# Patient Record
Sex: Female | Born: 1955 | Race: White | Hispanic: No | Marital: Married | Smoking: Never smoker
Health system: Southern US, Community
[De-identification: ages and names within clinical notes are randomized; demographics above are authoritative.]

## PROBLEM LIST (undated history)

## (undated) DIAGNOSIS — I10 Essential (primary) hypertension: Secondary | ICD-10-CM

## (undated) DIAGNOSIS — I341 Nonrheumatic mitral (valve) prolapse: Secondary | ICD-10-CM

## (undated) DIAGNOSIS — G9332 Myalgic encephalomyelitis/chronic fatigue syndrome: Secondary | ICD-10-CM

## (undated) DIAGNOSIS — E039 Hypothyroidism, unspecified: Secondary | ICD-10-CM

## (undated) DIAGNOSIS — F329 Major depressive disorder, single episode, unspecified: Secondary | ICD-10-CM

## (undated) DIAGNOSIS — R351 Nocturia: Secondary | ICD-10-CM

## (undated) DIAGNOSIS — F419 Anxiety disorder, unspecified: Secondary | ICD-10-CM

## (undated) DIAGNOSIS — IMO0002 Reserved for concepts with insufficient information to code with codable children: Secondary | ICD-10-CM

## (undated) DIAGNOSIS — N6019 Diffuse cystic mastopathy of unspecified breast: Secondary | ICD-10-CM

## (undated) DIAGNOSIS — R0683 Snoring: Principal | ICD-10-CM

## (undated) DIAGNOSIS — Z8489 Family history of other specified conditions: Secondary | ICD-10-CM

## (undated) DIAGNOSIS — E041 Nontoxic single thyroid nodule: Secondary | ICD-10-CM

## (undated) DIAGNOSIS — L719 Rosacea, unspecified: Secondary | ICD-10-CM

## (undated) DIAGNOSIS — I7300 Raynaud's syndrome without gangrene: Secondary | ICD-10-CM

## (undated) DIAGNOSIS — G43909 Migraine, unspecified, not intractable, without status migrainosus: Secondary | ICD-10-CM

## (undated) DIAGNOSIS — S060XAA Concussion with loss of consciousness status unknown, initial encounter: Secondary | ICD-10-CM

## (undated) DIAGNOSIS — R011 Cardiac murmur, unspecified: Secondary | ICD-10-CM

## (undated) DIAGNOSIS — K219 Gastro-esophageal reflux disease without esophagitis: Secondary | ICD-10-CM

## (undated) DIAGNOSIS — I340 Nonrheumatic mitral (valve) insufficiency: Secondary | ICD-10-CM

## (undated) DIAGNOSIS — R5382 Chronic fatigue, unspecified: Secondary | ICD-10-CM

## (undated) DIAGNOSIS — B009 Herpesviral infection, unspecified: Secondary | ICD-10-CM

## (undated) DIAGNOSIS — F32A Depression, unspecified: Secondary | ICD-10-CM

## (undated) HISTORY — PX: SHOULDER SURGERY: SHX246

## (undated) HISTORY — PX: BREAST EXCISIONAL BIOPSY: SUR124

## (undated) HISTORY — DX: Nonrheumatic mitral (valve) insufficiency: I34.0

## (undated) HISTORY — DX: Nontoxic single thyroid nodule: E04.1

## (undated) HISTORY — DX: Nonrheumatic mitral (valve) prolapse: I34.1

## (undated) HISTORY — DX: Hypothyroidism, unspecified: E03.9

## (undated) HISTORY — DX: Nocturia: R35.1

## (undated) HISTORY — DX: Reserved for concepts with insufficient information to code with codable children: IMO0002

## (undated) HISTORY — DX: Depression, unspecified: F32.A

## (undated) HISTORY — DX: Raynaud's syndrome without gangrene: I73.00

## (undated) HISTORY — PX: OTHER SURGICAL HISTORY: SHX169

## (undated) HISTORY — DX: Rosacea, unspecified: L71.9

## (undated) HISTORY — PX: DILATION AND CURETTAGE OF UTERUS: SHX78

## (undated) HISTORY — DX: Chronic fatigue, unspecified: R53.82

## (undated) HISTORY — DX: Migraine, unspecified, not intractable, without status migrainosus: G43.909

## (undated) HISTORY — DX: Herpesviral infection, unspecified: B00.9

## (undated) HISTORY — DX: Diffuse cystic mastopathy of unspecified breast: N60.19

## (undated) HISTORY — DX: Snoring: R06.83

## (undated) HISTORY — DX: Major depressive disorder, single episode, unspecified: F32.9

## (undated) HISTORY — DX: Myalgic encephalomyelitis/chronic fatigue syndrome: G93.32

---

## 1984-02-05 HISTORY — PX: MYOMECTOMY: SHX85

## 1998-04-13 ENCOUNTER — Other Ambulatory Visit: Admission: RE | Admit: 1998-04-13 | Discharge: 1998-04-13 | Payer: Self-pay | Admitting: Obstetrics and Gynecology

## 1998-05-10 ENCOUNTER — Encounter: Payer: Self-pay | Admitting: Obstetrics and Gynecology

## 1998-05-10 ENCOUNTER — Ambulatory Visit (HOSPITAL_COMMUNITY): Admission: RE | Admit: 1998-05-10 | Discharge: 1998-05-10 | Payer: Self-pay | Admitting: Obstetrics and Gynecology

## 1998-05-17 ENCOUNTER — Other Ambulatory Visit: Admission: RE | Admit: 1998-05-17 | Discharge: 1998-05-17 | Payer: Self-pay | Admitting: Obstetrics and Gynecology

## 1998-07-24 ENCOUNTER — Encounter: Payer: Self-pay | Admitting: Obstetrics and Gynecology

## 1998-07-24 ENCOUNTER — Ambulatory Visit (HOSPITAL_COMMUNITY): Admission: RE | Admit: 1998-07-24 | Discharge: 1998-07-24 | Payer: Self-pay | Admitting: Obstetrics and Gynecology

## 1998-07-28 ENCOUNTER — Encounter: Payer: Self-pay | Admitting: Obstetrics and Gynecology

## 1998-07-28 ENCOUNTER — Ambulatory Visit (HOSPITAL_COMMUNITY): Admission: RE | Admit: 1998-07-28 | Discharge: 1998-07-28 | Payer: Self-pay | Admitting: Obstetrics and Gynecology

## 1999-02-23 ENCOUNTER — Ambulatory Visit (HOSPITAL_COMMUNITY): Admission: RE | Admit: 1999-02-23 | Discharge: 1999-02-23 | Payer: Self-pay | Admitting: Obstetrics and Gynecology

## 1999-02-23 ENCOUNTER — Encounter: Payer: Self-pay | Admitting: Obstetrics and Gynecology

## 1999-04-16 ENCOUNTER — Other Ambulatory Visit: Admission: RE | Admit: 1999-04-16 | Discharge: 1999-04-16 | Payer: Self-pay | Admitting: Obstetrics and Gynecology

## 2000-07-17 ENCOUNTER — Ambulatory Visit (HOSPITAL_COMMUNITY): Admission: RE | Admit: 2000-07-17 | Discharge: 2000-07-17 | Payer: Self-pay | Admitting: Obstetrics and Gynecology

## 2000-07-17 ENCOUNTER — Encounter: Payer: Self-pay | Admitting: Obstetrics and Gynecology

## 2000-09-09 ENCOUNTER — Other Ambulatory Visit: Admission: RE | Admit: 2000-09-09 | Discharge: 2000-09-09 | Payer: Self-pay | Admitting: Obstetrics and Gynecology

## 2000-09-12 ENCOUNTER — Encounter: Payer: Self-pay | Admitting: Obstetrics and Gynecology

## 2000-09-12 ENCOUNTER — Encounter: Admission: RE | Admit: 2000-09-12 | Discharge: 2000-09-12 | Payer: Self-pay | Admitting: Obstetrics and Gynecology

## 2001-10-27 ENCOUNTER — Encounter: Payer: Self-pay | Admitting: Obstetrics and Gynecology

## 2001-10-27 ENCOUNTER — Encounter: Admission: RE | Admit: 2001-10-27 | Discharge: 2001-10-27 | Payer: Self-pay | Admitting: Obstetrics and Gynecology

## 2001-11-10 ENCOUNTER — Other Ambulatory Visit: Admission: RE | Admit: 2001-11-10 | Discharge: 2001-11-10 | Payer: Self-pay | Admitting: Obstetrics and Gynecology

## 2002-01-25 ENCOUNTER — Encounter: Payer: Self-pay | Admitting: Obstetrics and Gynecology

## 2002-01-25 ENCOUNTER — Encounter: Admission: RE | Admit: 2002-01-25 | Discharge: 2002-01-25 | Payer: Self-pay | Admitting: Obstetrics and Gynecology

## 2002-12-22 ENCOUNTER — Encounter: Admission: RE | Admit: 2002-12-22 | Discharge: 2002-12-22 | Payer: Self-pay | Admitting: Obstetrics and Gynecology

## 2003-01-03 ENCOUNTER — Other Ambulatory Visit: Admission: RE | Admit: 2003-01-03 | Discharge: 2003-01-03 | Payer: Self-pay | Admitting: Obstetrics and Gynecology

## 2004-05-11 ENCOUNTER — Encounter: Admission: RE | Admit: 2004-05-11 | Discharge: 2004-05-11 | Payer: Self-pay | Admitting: Obstetrics and Gynecology

## 2004-08-01 ENCOUNTER — Ambulatory Visit (HOSPITAL_BASED_OUTPATIENT_CLINIC_OR_DEPARTMENT_OTHER): Admission: RE | Admit: 2004-08-01 | Discharge: 2004-08-01 | Payer: Self-pay | Admitting: Obstetrics and Gynecology

## 2004-08-01 ENCOUNTER — Ambulatory Visit (HOSPITAL_COMMUNITY): Admission: RE | Admit: 2004-08-01 | Discharge: 2004-08-01 | Payer: Self-pay | Admitting: Obstetrics and Gynecology

## 2004-08-01 ENCOUNTER — Encounter (INDEPENDENT_AMBULATORY_CARE_PROVIDER_SITE_OTHER): Payer: Self-pay | Admitting: *Deleted

## 2004-12-11 ENCOUNTER — Encounter: Admission: RE | Admit: 2004-12-11 | Discharge: 2004-12-11 | Payer: Self-pay | Admitting: Internal Medicine

## 2005-05-17 ENCOUNTER — Encounter: Admission: RE | Admit: 2005-05-17 | Discharge: 2005-05-17 | Payer: Self-pay | Admitting: Obstetrics and Gynecology

## 2005-07-11 ENCOUNTER — Emergency Department (HOSPITAL_COMMUNITY): Admission: EM | Admit: 2005-07-11 | Discharge: 2005-07-12 | Payer: Self-pay | Admitting: Emergency Medicine

## 2006-06-10 ENCOUNTER — Encounter: Admission: RE | Admit: 2006-06-10 | Discharge: 2006-06-10 | Payer: Self-pay | Admitting: Obstetrics and Gynecology

## 2007-06-18 ENCOUNTER — Encounter: Admission: RE | Admit: 2007-06-18 | Discharge: 2007-06-18 | Payer: Self-pay | Admitting: Obstetrics and Gynecology

## 2008-06-28 ENCOUNTER — Encounter: Admission: RE | Admit: 2008-06-28 | Discharge: 2008-06-28 | Payer: Self-pay | Admitting: Obstetrics and Gynecology

## 2008-06-30 ENCOUNTER — Encounter: Admission: RE | Admit: 2008-06-30 | Discharge: 2008-06-30 | Payer: Self-pay | Admitting: Obstetrics and Gynecology

## 2009-07-05 ENCOUNTER — Encounter: Admission: RE | Admit: 2009-07-05 | Discharge: 2009-07-05 | Payer: Self-pay | Admitting: Obstetrics and Gynecology

## 2009-08-18 ENCOUNTER — Encounter: Admission: RE | Admit: 2009-08-18 | Discharge: 2009-08-18 | Payer: Self-pay | Admitting: Internal Medicine

## 2009-08-23 ENCOUNTER — Encounter: Admission: RE | Admit: 2009-08-23 | Discharge: 2009-08-23 | Payer: Self-pay | Admitting: Internal Medicine

## 2009-08-23 ENCOUNTER — Other Ambulatory Visit: Admission: RE | Admit: 2009-08-23 | Discharge: 2009-08-23 | Payer: Self-pay | Admitting: Interventional Radiology

## 2009-10-27 ENCOUNTER — Encounter: Admission: RE | Admit: 2009-10-27 | Discharge: 2009-10-27 | Payer: Self-pay | Admitting: Internal Medicine

## 2010-02-24 ENCOUNTER — Encounter: Payer: Self-pay | Admitting: Internal Medicine

## 2010-02-26 ENCOUNTER — Encounter: Payer: Self-pay | Admitting: Obstetrics and Gynecology

## 2010-06-11 ENCOUNTER — Ambulatory Visit
Admission: RE | Admit: 2010-06-11 | Discharge: 2010-06-11 | Disposition: A | Discharge status: Home / Self Care | Payer: Commercial Managed Care - PPO | Source: Ambulatory Visit | Attending: Internal Medicine | Admitting: Internal Medicine

## 2010-06-11 ENCOUNTER — Other Ambulatory Visit: Payer: Self-pay | Admitting: Internal Medicine

## 2010-06-11 DIAGNOSIS — M542 Cervicalgia: Secondary | ICD-10-CM

## 2010-06-22 NOTE — Op Note (Signed)
NAME:  RAYGAN, SKARDA               ACCOUNT NO.:  [id number]   MEDICAL RECORD NO.:  [id number]          PATIENT TYPE:  AMB   LOCATION:  [facility]                         FACILITY:  [facility]   PHYSICIAN:  Sherry A. Dickstein, M.D.DATE OF BIRTH:  11/02/1955   DATE OF PROCEDURE:  08/01/2004  DATE OF DISCHARGE:                                 OPERATIVE REPORT   PREOPERATIVE DIAGNOSES:  1.  Irregular bleeding.  2.  Endometrial polyp.   POSTOPERATIVE DIAGNOSES:  1.  Irregular bleeding.  2.  Submucosal fibroid.   PROCEDURE:  D&C hysteroscopy with resectoscope.   SURGEON:  Sherry A. Rosalio Macadamia, M.D.   ANESTHESIA:  MAC.   INDICATIONS FOR PROCEDURE:  This is a 55 year old, G0, P0 woman who has been  having very irregular bleeding every 2-3 weeks. The patient had an  ultrasound revealing a thickened endometrium with a possible endometrial  polyp present. The patient was in the office to have a sonohysterogram  performed; however, she had a significant vasovagal reaction. Because of  that, the decision was made to bring her right to the operating room for [facility]  hysteroscopy because a definitive diagnosis in the office could not be  obtained. It was felt that it was very likely that she had an endometrial  polyp and this would be the next diagnostic step.   FINDINGS:  Normal sized anteflexed uterus, no adnexal mass, submucosal  fibroid present.   DESCRIPTION OF PROCEDURE:  The patient was brought into the operating room  and given adequate IV sedation. She was placed in dorsal lithotomy position,  perineum was washed with Betadine. The patient was draped in a sterile  fashion after a pelvic examination was performed. A speculum was placed  within the vagina, the vagina was washed with Betadine, paracervical block  was administered with 1% Nesacaine. The anterior lip of the cervix was  grasped with a single tooth tenaculum, the cervix was sounded, cervix was  dilated with Pratt dilators to #31,  hysteroscope was introduced into the  endometrial cavity. Pictures were obtained. Using double loop right angle  resector, superficial resections of the endometrial tissue were obtained. On  the posterior wall of the uterus, a submucosal fibroid was present. This was  resected separately. Pictures were obtained circumferentially. Adequate  hemostasis was present. All instruments removed from the vagina. The patient  was taken out of the dorsal lithotomy position, she was awakened, she was  removed from the operating table to her stretcher in stable condition.  Complications were none. Estimated blood loss less than 5 mL.       SAD/MEDQ  D:  08/01/2004  T:  08/01/2004  Job:  [id number]

## 2010-07-18 ENCOUNTER — Other Ambulatory Visit: Payer: Self-pay | Admitting: Obstetrics

## 2010-07-18 DIAGNOSIS — Z1231 Encounter for screening mammogram for malignant neoplasm of breast: Secondary | ICD-10-CM

## 2010-07-23 ENCOUNTER — Other Ambulatory Visit: Payer: Self-pay | Admitting: Obstetrics

## 2010-07-25 ENCOUNTER — Other Ambulatory Visit: Payer: Self-pay | Admitting: Endocrinology

## 2010-07-25 DIAGNOSIS — E041 Nontoxic single thyroid nodule: Secondary | ICD-10-CM

## 2010-08-15 ENCOUNTER — Ambulatory Visit
Admission: RE | Admit: 2010-08-15 | Discharge: 2010-08-15 | Disposition: A | Discharge status: Home / Self Care | Payer: Commercial Managed Care - PPO | Source: Ambulatory Visit | Attending: Obstetrics | Admitting: Obstetrics

## 2010-08-15 DIAGNOSIS — Z1231 Encounter for screening mammogram for malignant neoplasm of breast: Secondary | ICD-10-CM

## 2010-10-15 ENCOUNTER — Ambulatory Visit
Admission: RE | Admit: 2010-10-15 | Discharge: 2010-10-15 | Disposition: A | Discharge status: Home / Self Care | Payer: Commercial Managed Care - PPO | Source: Ambulatory Visit | Attending: Endocrinology | Admitting: Endocrinology

## 2010-10-15 DIAGNOSIS — E041 Nontoxic single thyroid nodule: Secondary | ICD-10-CM

## 2011-02-14 ENCOUNTER — Other Ambulatory Visit: Payer: Self-pay | Admitting: Internal Medicine

## 2011-02-14 DIAGNOSIS — R221 Localized swelling, mass and lump, neck: Secondary | ICD-10-CM

## 2011-02-15 ENCOUNTER — Ambulatory Visit
Admission: RE | Admit: 2011-02-15 | Discharge: 2011-02-15 | Disposition: A | Discharge status: Home / Self Care | Payer: Commercial Managed Care - PPO | Source: Ambulatory Visit | Attending: Internal Medicine | Admitting: Internal Medicine

## 2011-02-15 ENCOUNTER — Inpatient Hospital Stay: Admission: RE | Admit: 2011-02-15 | Payer: Commercial Managed Care - PPO | Source: Ambulatory Visit

## 2011-02-15 DIAGNOSIS — R221 Localized swelling, mass and lump, neck: Secondary | ICD-10-CM

## 2011-02-15 MED ORDER — IOHEXOL 300 MG/ML  SOLN
75.0000 mL | Freq: Once | INTRAMUSCULAR | Status: AC | PRN
  Administered 2011-02-15: 75 mL via INTRAVENOUS

## 2011-07-29 ENCOUNTER — Other Ambulatory Visit: Payer: Self-pay | Admitting: Obstetrics

## 2011-07-29 DIAGNOSIS — Z1231 Encounter for screening mammogram for malignant neoplasm of breast: Secondary | ICD-10-CM

## 2011-08-22 ENCOUNTER — Ambulatory Visit
Admission: RE | Admit: 2011-08-22 | Discharge: 2011-08-22 | Disposition: A | Discharge status: Home / Self Care | Payer: Commercial Managed Care - PPO | Source: Ambulatory Visit | Attending: Obstetrics | Admitting: Obstetrics

## 2011-08-22 DIAGNOSIS — Z1231 Encounter for screening mammogram for malignant neoplasm of breast: Secondary | ICD-10-CM

## 2011-10-28 ENCOUNTER — Other Ambulatory Visit: Payer: Self-pay | Admitting: Endocrinology

## 2011-10-28 DIAGNOSIS — E041 Nontoxic single thyroid nodule: Secondary | ICD-10-CM

## 2011-10-31 ENCOUNTER — Ambulatory Visit
Admission: RE | Admit: 2011-10-31 | Discharge: 2011-10-31 | Disposition: A | Discharge status: Home / Self Care | Payer: Commercial Managed Care - PPO | Source: Ambulatory Visit | Attending: Endocrinology | Admitting: Endocrinology

## 2011-10-31 DIAGNOSIS — E041 Nontoxic single thyroid nodule: Secondary | ICD-10-CM

## 2012-05-29 ENCOUNTER — Ambulatory Visit (INDEPENDENT_AMBULATORY_CARE_PROVIDER_SITE_OTHER): Payer: Commercial Managed Care - PPO | Admitting: Ophthalmology

## 2012-05-29 DIAGNOSIS — H2510 Age-related nuclear cataract, unspecified eye: Secondary | ICD-10-CM

## 2012-05-29 DIAGNOSIS — H33309 Unspecified retinal break, unspecified eye: Secondary | ICD-10-CM

## 2012-05-29 DIAGNOSIS — H43819 Vitreous degeneration, unspecified eye: Secondary | ICD-10-CM

## 2012-07-23 ENCOUNTER — Other Ambulatory Visit: Payer: Self-pay

## 2012-07-23 DIAGNOSIS — Z1231 Encounter for screening mammogram for malignant neoplasm of breast: Secondary | ICD-10-CM

## 2012-08-28 ENCOUNTER — Ambulatory Visit: Payer: Commercial Managed Care - PPO

## 2012-09-17 ENCOUNTER — Ambulatory Visit: Payer: Commercial Managed Care - PPO

## 2012-09-30 ENCOUNTER — Ambulatory Visit
Admission: RE | Admit: 2012-09-30 | Discharge: 2012-09-30 | Disposition: A | Discharge status: Home / Self Care | Payer: Commercial Managed Care - PPO | Source: Ambulatory Visit

## 2012-09-30 DIAGNOSIS — Z1231 Encounter for screening mammogram for malignant neoplasm of breast: Secondary | ICD-10-CM

## 2012-10-01 ENCOUNTER — Other Ambulatory Visit: Payer: Self-pay | Admitting: Internal Medicine

## 2012-10-01 DIAGNOSIS — R928 Other abnormal and inconclusive findings on diagnostic imaging of breast: Secondary | ICD-10-CM

## 2012-10-02 ENCOUNTER — Ambulatory Visit
Admission: RE | Admit: 2012-10-02 | Discharge: 2012-10-02 | Disposition: A | Discharge status: Home / Self Care | Payer: Commercial Managed Care - PPO | Source: Ambulatory Visit | Attending: Internal Medicine | Admitting: Internal Medicine

## 2012-10-02 DIAGNOSIS — R928 Other abnormal and inconclusive findings on diagnostic imaging of breast: Secondary | ICD-10-CM

## 2012-10-28 ENCOUNTER — Other Ambulatory Visit: Payer: Self-pay | Admitting: Endocrinology

## 2012-10-28 DIAGNOSIS — E049 Nontoxic goiter, unspecified: Secondary | ICD-10-CM

## 2012-12-07 ENCOUNTER — Other Ambulatory Visit: Payer: Self-pay | Admitting: Dermatology

## 2013-07-09 ENCOUNTER — Encounter: Payer: Self-pay | Admitting: Neurology

## 2013-07-23 ENCOUNTER — Encounter: Payer: Self-pay | Admitting: *Deleted

## 2013-07-23 ENCOUNTER — Ambulatory Visit (INDEPENDENT_AMBULATORY_CARE_PROVIDER_SITE_OTHER): Payer: Commercial Managed Care - PPO | Admitting: Neurology

## 2013-07-23 VITALS — BP 128/77 | HR 72 | Resp 16 | Ht 67.0 in | Wt 128.0 lb

## 2013-07-23 DIAGNOSIS — R0683 Snoring: Secondary | ICD-10-CM | POA: Insufficient documentation

## 2013-07-23 DIAGNOSIS — R0609 Other forms of dyspnea: Secondary | ICD-10-CM

## 2013-07-23 DIAGNOSIS — R351 Nocturia: Secondary | ICD-10-CM

## 2013-07-23 DIAGNOSIS — R0989 Other specified symptoms and signs involving the circulatory and respiratory systems: Secondary | ICD-10-CM

## 2013-07-23 HISTORY — DX: Snoring: R06.83

## 2013-07-23 HISTORY — DX: Nocturia: R35.1
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
## 2013-07-30 ENCOUNTER — Encounter: Payer: Self-pay | Admitting: *Deleted

## 2013-07-30 ENCOUNTER — Ambulatory Visit (INDEPENDENT_AMBULATORY_CARE_PROVIDER_SITE_OTHER): Payer: Commercial Managed Care - PPO | Admitting: Neurology

## 2013-07-30 DIAGNOSIS — R351 Nocturia: Secondary | ICD-10-CM

## 2013-07-30 DIAGNOSIS — R0683 Snoring: Secondary | ICD-10-CM

## 2013-07-30 NOTE — Sleep Study (Signed)
Patient arrives for HST instruction.  Patient is given written instructions, picture instructions, and a demonstration on how to use HST unit.  All questions / concerns were addressed by technologist.  Financial responsibility was explained.  Follow up information was given to patient regarding how results would be received.

## 2013-08-11 NOTE — Sleep Study (Signed)
Called and LM for patient that we have not seen her HST come back yet.  Asked her to contact us with more information or return the unit so we can download and score it.

## 2013-08-12 DIAGNOSIS — R0609 Other forms of dyspnea: Secondary | ICD-10-CM

## 2013-08-12 DIAGNOSIS — R0989 Other specified symptoms and signs involving the circulatory and respiratory systems: Secondary | ICD-10-CM

## 2013-08-18 ENCOUNTER — Telehealth: Payer: Self-pay | Admitting: Neurology

## 2013-08-18 NOTE — Telephone Encounter (Signed)
Called the patient and provided results from recent home sleep test.  Pt is aware and understands no evidence of sleep apnea or sleep disordered breathing indicated.  She was advised that she can follow up with Dr. Brett Fairy, however she is comfortable with negative finding and will follow up with Dr. Joylene Draft.  A copy of the patients home sleep test report will be faxed to the referring provider and mailed to the patient's home address.

## 2013-09-29 ENCOUNTER — Other Ambulatory Visit: Payer: Self-pay

## 2013-09-29 DIAGNOSIS — Z1231 Encounter for screening mammogram for malignant neoplasm of breast: Secondary | ICD-10-CM

## 2013-10-18 ENCOUNTER — Other Ambulatory Visit: Payer: Commercial Managed Care - PPO

## 2013-10-20 ENCOUNTER — Ambulatory Visit
Admission: RE | Admit: 2013-10-20 | Discharge: 2013-10-20 | Disposition: A | Discharge status: Home / Self Care | Payer: Commercial Managed Care - PPO | Source: Ambulatory Visit

## 2013-10-20 ENCOUNTER — Ambulatory Visit
Admission: RE | Admit: 2013-10-20 | Discharge: 2013-10-20 | Disposition: A | Discharge status: Home / Self Care | Payer: Commercial Managed Care - PPO | Source: Ambulatory Visit | Attending: Endocrinology | Admitting: Endocrinology

## 2013-10-20 DIAGNOSIS — E049 Nontoxic goiter, unspecified: Secondary | ICD-10-CM

## 2013-10-20 DIAGNOSIS — Z1231 Encounter for screening mammogram for malignant neoplasm of breast: Secondary | ICD-10-CM

## 2014-10-03 ENCOUNTER — Other Ambulatory Visit: Payer: Self-pay

## 2014-10-03 DIAGNOSIS — Z1231 Encounter for screening mammogram for malignant neoplasm of breast: Secondary | ICD-10-CM

## 2014-11-14 ENCOUNTER — Ambulatory Visit: Payer: Commercial Managed Care - PPO

## 2014-11-24 ENCOUNTER — Ambulatory Visit
Admission: RE | Admit: 2014-11-24 | Discharge: 2014-11-24 | Disposition: A | Discharge status: Home / Self Care | Payer: Commercial Managed Care - PPO | Source: Ambulatory Visit

## 2014-11-24 DIAGNOSIS — Z1231 Encounter for screening mammogram for malignant neoplasm of breast: Secondary | ICD-10-CM

## 2014-11-28 ENCOUNTER — Other Ambulatory Visit: Payer: Self-pay | Admitting: Internal Medicine

## 2014-11-28 DIAGNOSIS — R928 Other abnormal and inconclusive findings on diagnostic imaging of breast: Secondary | ICD-10-CM

## 2014-12-01 ENCOUNTER — Ambulatory Visit
Admission: RE | Admit: 2014-12-01 | Discharge: 2014-12-01 | Disposition: A | Discharge status: Home / Self Care | Payer: Commercial Managed Care - PPO | Source: Ambulatory Visit | Attending: Internal Medicine | Admitting: Internal Medicine

## 2014-12-01 DIAGNOSIS — R928 Other abnormal and inconclusive findings on diagnostic imaging of breast: Secondary | ICD-10-CM

## 2014-12-08 ENCOUNTER — Ambulatory Visit
Admission: RE | Admit: 2014-12-08 | Discharge: 2014-12-08 | Disposition: A | Discharge status: Home / Self Care | Payer: Commercial Managed Care - PPO | Source: Ambulatory Visit | Attending: Internal Medicine | Admitting: Internal Medicine

## 2014-12-08 ENCOUNTER — Other Ambulatory Visit: Payer: Self-pay | Admitting: Internal Medicine

## 2014-12-08 DIAGNOSIS — M542 Cervicalgia: Secondary | ICD-10-CM

## 2014-12-13 ENCOUNTER — Ambulatory Visit: Payer: Commercial Managed Care - PPO

## 2015-01-04 ENCOUNTER — Other Ambulatory Visit: Payer: Self-pay | Admitting: Endocrinology

## 2015-01-04 DIAGNOSIS — E049 Nontoxic goiter, unspecified: Secondary | ICD-10-CM

## 2015-01-09 ENCOUNTER — Ambulatory Visit
Admission: RE | Admit: 2015-01-09 | Discharge: 2015-01-09 | Disposition: A | Discharge status: Home / Self Care | Payer: Commercial Managed Care - PPO | Source: Ambulatory Visit | Attending: Endocrinology | Admitting: Endocrinology

## 2015-01-09 DIAGNOSIS — E049 Nontoxic goiter, unspecified: Secondary | ICD-10-CM

## 2015-01-17 ENCOUNTER — Other Ambulatory Visit: Payer: Self-pay | Admitting: Endocrinology

## 2015-01-17 DIAGNOSIS — E041 Nontoxic single thyroid nodule: Secondary | ICD-10-CM

## 2015-01-19 ENCOUNTER — Inpatient Hospital Stay: Admission: RE | Admit: 2015-01-19 | Payer: Commercial Managed Care - PPO | Source: Ambulatory Visit

## 2015-02-07 ENCOUNTER — Other Ambulatory Visit (HOSPITAL_COMMUNITY)
Admission: RE | Admit: 2015-02-07 | Discharge: 2015-02-07 | Disposition: A | Discharge status: Home / Self Care | Payer: Commercial Managed Care - PPO | Source: Ambulatory Visit | Attending: Interventional Radiology | Admitting: Interventional Radiology

## 2015-02-07 ENCOUNTER — Ambulatory Visit
Admission: RE | Admit: 2015-02-07 | Discharge: 2015-02-07 | Disposition: A | Discharge status: Home / Self Care | Payer: Commercial Managed Care - PPO | Source: Ambulatory Visit | Attending: Endocrinology | Admitting: Endocrinology

## 2015-02-07 DIAGNOSIS — E041 Nontoxic single thyroid nodule: Secondary | ICD-10-CM

## 2015-08-28 ENCOUNTER — Other Ambulatory Visit: Payer: Self-pay | Admitting: Endocrinology

## 2015-08-28 DIAGNOSIS — E049 Nontoxic goiter, unspecified: Secondary | ICD-10-CM

## 2015-10-20 ENCOUNTER — Other Ambulatory Visit: Payer: Commercial Managed Care - PPO

## 2015-10-24 ENCOUNTER — Ambulatory Visit
Admission: RE | Admit: 2015-10-24 | Discharge: 2015-10-24 | Disposition: A | Discharge status: Home / Self Care | Payer: Commercial Managed Care - PPO | Source: Ambulatory Visit | Attending: Endocrinology | Admitting: Endocrinology

## 2015-10-24 DIAGNOSIS — E049 Nontoxic goiter, unspecified: Secondary | ICD-10-CM

## 2015-10-26 ENCOUNTER — Other Ambulatory Visit: Payer: Self-pay | Admitting: Endocrinology

## 2015-10-26 DIAGNOSIS — E041 Nontoxic single thyroid nodule: Secondary | ICD-10-CM

## 2015-10-27 ENCOUNTER — Other Ambulatory Visit: Payer: Self-pay | Admitting: Endocrinology

## 2015-10-27 DIAGNOSIS — E041 Nontoxic single thyroid nodule: Secondary | ICD-10-CM

## 2015-11-08 ENCOUNTER — Ambulatory Visit
Admission: RE | Admit: 2015-11-08 | Discharge: 2015-11-08 | Disposition: A | Discharge status: Home / Self Care | Payer: Commercial Managed Care - PPO | Source: Ambulatory Visit | Attending: Endocrinology | Admitting: Endocrinology

## 2015-11-08 ENCOUNTER — Other Ambulatory Visit (HOSPITAL_COMMUNITY)
Admission: RE | Admit: 2015-11-08 | Discharge: 2015-11-08 | Disposition: A | Discharge status: Home / Self Care | Payer: Commercial Managed Care - PPO | Source: Ambulatory Visit | Attending: Interventional Radiology | Admitting: Interventional Radiology

## 2015-11-08 DIAGNOSIS — E041 Nontoxic single thyroid nodule: Secondary | ICD-10-CM

## 2015-11-09 ENCOUNTER — Other Ambulatory Visit: Payer: Self-pay | Admitting: Obstetrics

## 2015-11-09 DIAGNOSIS — Z1231 Encounter for screening mammogram for malignant neoplasm of breast: Secondary | ICD-10-CM

## 2015-11-29 ENCOUNTER — Ambulatory Visit
Admission: RE | Admit: 2015-11-29 | Discharge: 2015-11-29 | Disposition: A | Discharge status: Home / Self Care | Payer: Commercial Managed Care - PPO | Source: Ambulatory Visit | Attending: Obstetrics | Admitting: Obstetrics

## 2015-11-29 DIAGNOSIS — Z1231 Encounter for screening mammogram for malignant neoplasm of breast: Secondary | ICD-10-CM

## 2016-03-07 DIAGNOSIS — B349 Viral infection, unspecified: Secondary | ICD-10-CM | POA: Diagnosis not present

## 2016-03-07 DIAGNOSIS — J069 Acute upper respiratory infection, unspecified: Secondary | ICD-10-CM | POA: Diagnosis not present

## 2016-03-07 DIAGNOSIS — J029 Acute pharyngitis, unspecified: Secondary | ICD-10-CM | POA: Diagnosis not present

## 2016-04-17 DIAGNOSIS — M7541 Impingement syndrome of right shoulder: Secondary | ICD-10-CM | POA: Diagnosis not present

## 2016-04-22 DIAGNOSIS — M7541 Impingement syndrome of right shoulder: Secondary | ICD-10-CM | POA: Diagnosis not present

## 2016-05-01 DIAGNOSIS — M7541 Impingement syndrome of right shoulder: Secondary | ICD-10-CM | POA: Diagnosis not present

## 2016-05-06 DIAGNOSIS — M7541 Impingement syndrome of right shoulder: Secondary | ICD-10-CM | POA: Diagnosis not present

## 2016-05-13 DIAGNOSIS — M7541 Impingement syndrome of right shoulder: Secondary | ICD-10-CM | POA: Diagnosis not present

## 2016-05-28 DIAGNOSIS — M7541 Impingement syndrome of right shoulder: Secondary | ICD-10-CM | POA: Diagnosis not present

## 2016-05-29 DIAGNOSIS — M7541 Impingement syndrome of right shoulder: Secondary | ICD-10-CM | POA: Diagnosis not present

## 2016-05-29 DIAGNOSIS — M7712 Lateral epicondylitis, left elbow: Secondary | ICD-10-CM | POA: Diagnosis not present

## 2016-06-06 DIAGNOSIS — M7541 Impingement syndrome of right shoulder: Secondary | ICD-10-CM | POA: Diagnosis not present

## 2016-06-13 DIAGNOSIS — M7541 Impingement syndrome of right shoulder: Secondary | ICD-10-CM | POA: Diagnosis not present

## 2016-07-08 DIAGNOSIS — M7541 Impingement syndrome of right shoulder: Secondary | ICD-10-CM | POA: Diagnosis not present

## 2016-07-08 DIAGNOSIS — M7712 Lateral epicondylitis, left elbow: Secondary | ICD-10-CM | POA: Diagnosis not present

## 2016-07-10 DIAGNOSIS — H25813 Combined forms of age-related cataract, bilateral: Secondary | ICD-10-CM | POA: Diagnosis not present

## 2016-07-10 DIAGNOSIS — H5213 Myopia, bilateral: Secondary | ICD-10-CM | POA: Diagnosis not present

## 2016-07-10 DIAGNOSIS — H52223 Regular astigmatism, bilateral: Secondary | ICD-10-CM | POA: Diagnosis not present

## 2016-07-16 DIAGNOSIS — M7541 Impingement syndrome of right shoulder: Secondary | ICD-10-CM | POA: Diagnosis not present

## 2016-07-31 DIAGNOSIS — S50862A Insect bite (nonvenomous) of left forearm, initial encounter: Secondary | ICD-10-CM | POA: Diagnosis not present

## 2016-07-31 DIAGNOSIS — W57XXXA Bitten or stung by nonvenomous insect and other nonvenomous arthropods, initial encounter: Secondary | ICD-10-CM | POA: Diagnosis not present

## 2016-10-01 DIAGNOSIS — L309 Dermatitis, unspecified: Secondary | ICD-10-CM | POA: Diagnosis not present

## 2016-10-01 DIAGNOSIS — M859 Disorder of bone density and structure, unspecified: Secondary | ICD-10-CM | POA: Diagnosis not present

## 2016-10-01 DIAGNOSIS — Z79899 Other long term (current) drug therapy: Secondary | ICD-10-CM | POA: Diagnosis not present

## 2016-10-01 DIAGNOSIS — E041 Nontoxic single thyroid nodule: Secondary | ICD-10-CM | POA: Diagnosis not present

## 2016-10-15 DIAGNOSIS — M25511 Pain in right shoulder: Secondary | ICD-10-CM | POA: Diagnosis not present

## 2016-10-15 DIAGNOSIS — Z0000 Encounter for general adult medical examination without abnormal findings: Secondary | ICD-10-CM | POA: Diagnosis not present

## 2016-10-15 DIAGNOSIS — Z1389 Encounter for screening for other disorder: Secondary | ICD-10-CM | POA: Diagnosis not present

## 2016-10-15 DIAGNOSIS — M859 Disorder of bone density and structure, unspecified: Secondary | ICD-10-CM | POA: Diagnosis not present

## 2016-10-15 DIAGNOSIS — R030 Elevated blood-pressure reading, without diagnosis of hypertension: Secondary | ICD-10-CM | POA: Diagnosis not present

## 2016-10-25 DIAGNOSIS — M7501 Adhesive capsulitis of right shoulder: Secondary | ICD-10-CM | POA: Diagnosis not present

## 2016-10-30 DIAGNOSIS — E049 Nontoxic goiter, unspecified: Secondary | ICD-10-CM | POA: Diagnosis not present

## 2016-10-30 DIAGNOSIS — E039 Hypothyroidism, unspecified: Secondary | ICD-10-CM | POA: Diagnosis not present

## 2016-11-02 DIAGNOSIS — Z23 Encounter for immunization: Secondary | ICD-10-CM | POA: Diagnosis not present

## 2016-11-05 ENCOUNTER — Other Ambulatory Visit: Payer: Self-pay | Admitting: Obstetrics

## 2016-11-05 DIAGNOSIS — Z1231 Encounter for screening mammogram for malignant neoplasm of breast: Secondary | ICD-10-CM

## 2016-11-05 DIAGNOSIS — Z6820 Body mass index (BMI) 20.0-20.9, adult: Secondary | ICD-10-CM | POA: Diagnosis not present

## 2016-11-05 DIAGNOSIS — Z01419 Encounter for gynecological examination (general) (routine) without abnormal findings: Secondary | ICD-10-CM | POA: Diagnosis not present

## 2016-11-21 DIAGNOSIS — H524 Presbyopia: Secondary | ICD-10-CM | POA: Diagnosis not present

## 2016-11-26 DIAGNOSIS — G8918 Other acute postprocedural pain: Secondary | ICD-10-CM | POA: Diagnosis not present

## 2016-11-26 DIAGNOSIS — M7501 Adhesive capsulitis of right shoulder: Secondary | ICD-10-CM | POA: Diagnosis not present

## 2016-11-26 DIAGNOSIS — M7541 Impingement syndrome of right shoulder: Secondary | ICD-10-CM | POA: Diagnosis not present

## 2016-11-27 DIAGNOSIS — M7501 Adhesive capsulitis of right shoulder: Secondary | ICD-10-CM | POA: Diagnosis not present

## 2016-11-27 DIAGNOSIS — M25611 Stiffness of right shoulder, not elsewhere classified: Secondary | ICD-10-CM | POA: Diagnosis not present

## 2016-11-27 DIAGNOSIS — M25511 Pain in right shoulder: Secondary | ICD-10-CM | POA: Diagnosis not present

## 2016-11-28 DIAGNOSIS — M25611 Stiffness of right shoulder, not elsewhere classified: Secondary | ICD-10-CM | POA: Diagnosis not present

## 2016-11-28 DIAGNOSIS — M7501 Adhesive capsulitis of right shoulder: Secondary | ICD-10-CM | POA: Diagnosis not present

## 2016-11-28 DIAGNOSIS — M25511 Pain in right shoulder: Secondary | ICD-10-CM | POA: Diagnosis not present

## 2016-12-03 DIAGNOSIS — M25611 Stiffness of right shoulder, not elsewhere classified: Secondary | ICD-10-CM | POA: Diagnosis not present

## 2016-12-03 DIAGNOSIS — M7501 Adhesive capsulitis of right shoulder: Secondary | ICD-10-CM | POA: Diagnosis not present

## 2016-12-03 DIAGNOSIS — M25511 Pain in right shoulder: Secondary | ICD-10-CM | POA: Diagnosis not present

## 2016-12-04 DIAGNOSIS — M7501 Adhesive capsulitis of right shoulder: Secondary | ICD-10-CM | POA: Diagnosis not present

## 2016-12-04 DIAGNOSIS — M25511 Pain in right shoulder: Secondary | ICD-10-CM | POA: Diagnosis not present

## 2016-12-04 DIAGNOSIS — M25611 Stiffness of right shoulder, not elsewhere classified: Secondary | ICD-10-CM | POA: Diagnosis not present

## 2016-12-06 DIAGNOSIS — M25611 Stiffness of right shoulder, not elsewhere classified: Secondary | ICD-10-CM | POA: Diagnosis not present

## 2016-12-06 DIAGNOSIS — M25511 Pain in right shoulder: Secondary | ICD-10-CM | POA: Diagnosis not present

## 2016-12-06 DIAGNOSIS — M7501 Adhesive capsulitis of right shoulder: Secondary | ICD-10-CM | POA: Diagnosis not present

## 2016-12-11 DIAGNOSIS — M7501 Adhesive capsulitis of right shoulder: Secondary | ICD-10-CM | POA: Diagnosis not present

## 2016-12-11 DIAGNOSIS — M25511 Pain in right shoulder: Secondary | ICD-10-CM | POA: Diagnosis not present

## 2016-12-11 DIAGNOSIS — M25611 Stiffness of right shoulder, not elsewhere classified: Secondary | ICD-10-CM | POA: Diagnosis not present

## 2016-12-13 DIAGNOSIS — M25611 Stiffness of right shoulder, not elsewhere classified: Secondary | ICD-10-CM | POA: Diagnosis not present

## 2016-12-13 DIAGNOSIS — M7501 Adhesive capsulitis of right shoulder: Secondary | ICD-10-CM | POA: Diagnosis not present

## 2016-12-13 DIAGNOSIS — M25511 Pain in right shoulder: Secondary | ICD-10-CM | POA: Diagnosis not present

## 2016-12-16 ENCOUNTER — Ambulatory Visit
Admission: RE | Admit: 2016-12-16 | Discharge: 2016-12-16 | Disposition: A | Discharge status: Home / Self Care | Payer: Commercial Managed Care - PPO | Source: Ambulatory Visit | Attending: Obstetrics | Admitting: Obstetrics

## 2016-12-16 DIAGNOSIS — Z1231 Encounter for screening mammogram for malignant neoplasm of breast: Secondary | ICD-10-CM

## 2016-12-16 DIAGNOSIS — M25611 Stiffness of right shoulder, not elsewhere classified: Secondary | ICD-10-CM | POA: Diagnosis not present

## 2016-12-16 DIAGNOSIS — M7501 Adhesive capsulitis of right shoulder: Secondary | ICD-10-CM | POA: Diagnosis not present

## 2016-12-16 DIAGNOSIS — M25511 Pain in right shoulder: Secondary | ICD-10-CM | POA: Diagnosis not present

## 2016-12-18 DIAGNOSIS — M7501 Adhesive capsulitis of right shoulder: Secondary | ICD-10-CM | POA: Diagnosis not present

## 2016-12-18 DIAGNOSIS — M25511 Pain in right shoulder: Secondary | ICD-10-CM | POA: Diagnosis not present

## 2016-12-18 DIAGNOSIS — M25611 Stiffness of right shoulder, not elsewhere classified: Secondary | ICD-10-CM | POA: Diagnosis not present

## 2016-12-20 DIAGNOSIS — M7501 Adhesive capsulitis of right shoulder: Secondary | ICD-10-CM | POA: Diagnosis not present

## 2016-12-20 DIAGNOSIS — M25511 Pain in right shoulder: Secondary | ICD-10-CM | POA: Diagnosis not present

## 2016-12-20 DIAGNOSIS — M25611 Stiffness of right shoulder, not elsewhere classified: Secondary | ICD-10-CM | POA: Diagnosis not present

## 2017-01-03 DIAGNOSIS — M25611 Stiffness of right shoulder, not elsewhere classified: Secondary | ICD-10-CM | POA: Diagnosis not present

## 2017-01-03 DIAGNOSIS — M7501 Adhesive capsulitis of right shoulder: Secondary | ICD-10-CM | POA: Diagnosis not present

## 2017-01-03 DIAGNOSIS — M25511 Pain in right shoulder: Secondary | ICD-10-CM | POA: Diagnosis not present

## 2017-01-06 DIAGNOSIS — M25511 Pain in right shoulder: Secondary | ICD-10-CM | POA: Diagnosis not present

## 2017-01-06 DIAGNOSIS — M7501 Adhesive capsulitis of right shoulder: Secondary | ICD-10-CM | POA: Diagnosis not present

## 2017-01-06 DIAGNOSIS — M25611 Stiffness of right shoulder, not elsewhere classified: Secondary | ICD-10-CM | POA: Diagnosis not present

## 2017-01-09 DIAGNOSIS — M25511 Pain in right shoulder: Secondary | ICD-10-CM | POA: Diagnosis not present

## 2017-01-09 DIAGNOSIS — M7501 Adhesive capsulitis of right shoulder: Secondary | ICD-10-CM | POA: Diagnosis not present

## 2017-01-09 DIAGNOSIS — M25611 Stiffness of right shoulder, not elsewhere classified: Secondary | ICD-10-CM | POA: Diagnosis not present

## 2017-01-16 DIAGNOSIS — M25611 Stiffness of right shoulder, not elsewhere classified: Secondary | ICD-10-CM | POA: Diagnosis not present

## 2017-01-16 DIAGNOSIS — M7501 Adhesive capsulitis of right shoulder: Secondary | ICD-10-CM | POA: Diagnosis not present

## 2017-01-16 DIAGNOSIS — M25511 Pain in right shoulder: Secondary | ICD-10-CM | POA: Diagnosis not present

## 2017-01-20 DIAGNOSIS — L570 Actinic keratosis: Secondary | ICD-10-CM | POA: Diagnosis not present

## 2017-01-20 DIAGNOSIS — Z86018 Personal history of other benign neoplasm: Secondary | ICD-10-CM | POA: Diagnosis not present

## 2017-01-20 DIAGNOSIS — D1801 Hemangioma of skin and subcutaneous tissue: Secondary | ICD-10-CM | POA: Diagnosis not present

## 2017-01-20 DIAGNOSIS — L814 Other melanin hyperpigmentation: Secondary | ICD-10-CM | POA: Diagnosis not present

## 2017-01-20 DIAGNOSIS — D485 Neoplasm of uncertain behavior of skin: Secondary | ICD-10-CM | POA: Diagnosis not present

## 2017-01-21 DIAGNOSIS — M25511 Pain in right shoulder: Secondary | ICD-10-CM | POA: Diagnosis not present

## 2017-01-21 DIAGNOSIS — M7501 Adhesive capsulitis of right shoulder: Secondary | ICD-10-CM | POA: Diagnosis not present

## 2017-01-21 DIAGNOSIS — M25611 Stiffness of right shoulder, not elsewhere classified: Secondary | ICD-10-CM | POA: Diagnosis not present

## 2017-01-23 DIAGNOSIS — M25611 Stiffness of right shoulder, not elsewhere classified: Secondary | ICD-10-CM | POA: Diagnosis not present

## 2017-01-23 DIAGNOSIS — M25511 Pain in right shoulder: Secondary | ICD-10-CM | POA: Diagnosis not present

## 2017-01-23 DIAGNOSIS — M7501 Adhesive capsulitis of right shoulder: Secondary | ICD-10-CM | POA: Diagnosis not present

## 2017-01-30 DIAGNOSIS — M25511 Pain in right shoulder: Secondary | ICD-10-CM | POA: Diagnosis not present

## 2017-01-30 DIAGNOSIS — M25611 Stiffness of right shoulder, not elsewhere classified: Secondary | ICD-10-CM | POA: Diagnosis not present

## 2017-01-30 DIAGNOSIS — M7501 Adhesive capsulitis of right shoulder: Secondary | ICD-10-CM | POA: Diagnosis not present

## 2017-02-05 DIAGNOSIS — M25511 Pain in right shoulder: Secondary | ICD-10-CM | POA: Diagnosis not present

## 2017-02-05 DIAGNOSIS — M7501 Adhesive capsulitis of right shoulder: Secondary | ICD-10-CM | POA: Diagnosis not present

## 2017-02-05 DIAGNOSIS — M25611 Stiffness of right shoulder, not elsewhere classified: Secondary | ICD-10-CM | POA: Diagnosis not present

## 2017-02-10 DIAGNOSIS — M7501 Adhesive capsulitis of right shoulder: Secondary | ICD-10-CM | POA: Diagnosis not present

## 2017-02-10 DIAGNOSIS — M25611 Stiffness of right shoulder, not elsewhere classified: Secondary | ICD-10-CM | POA: Diagnosis not present

## 2017-02-10 DIAGNOSIS — M25511 Pain in right shoulder: Secondary | ICD-10-CM | POA: Diagnosis not present

## 2017-02-12 DIAGNOSIS — M7501 Adhesive capsulitis of right shoulder: Secondary | ICD-10-CM | POA: Diagnosis not present

## 2017-02-12 DIAGNOSIS — M25511 Pain in right shoulder: Secondary | ICD-10-CM | POA: Diagnosis not present

## 2017-02-12 DIAGNOSIS — M25611 Stiffness of right shoulder, not elsewhere classified: Secondary | ICD-10-CM | POA: Diagnosis not present

## 2017-02-27 DIAGNOSIS — M25511 Pain in right shoulder: Secondary | ICD-10-CM | POA: Diagnosis not present

## 2017-02-27 DIAGNOSIS — M7501 Adhesive capsulitis of right shoulder: Secondary | ICD-10-CM | POA: Diagnosis not present

## 2017-02-27 DIAGNOSIS — M25611 Stiffness of right shoulder, not elsewhere classified: Secondary | ICD-10-CM | POA: Diagnosis not present

## 2017-03-04 DIAGNOSIS — M25611 Stiffness of right shoulder, not elsewhere classified: Secondary | ICD-10-CM | POA: Diagnosis not present

## 2017-03-04 DIAGNOSIS — M25511 Pain in right shoulder: Secondary | ICD-10-CM | POA: Diagnosis not present

## 2017-03-04 DIAGNOSIS — M7501 Adhesive capsulitis of right shoulder: Secondary | ICD-10-CM | POA: Diagnosis not present

## 2017-04-01 DIAGNOSIS — H2513 Age-related nuclear cataract, bilateral: Secondary | ICD-10-CM | POA: Diagnosis not present

## 2017-04-01 DIAGNOSIS — H524 Presbyopia: Secondary | ICD-10-CM | POA: Diagnosis not present

## 2017-04-02 DIAGNOSIS — L918 Other hypertrophic disorders of the skin: Secondary | ICD-10-CM | POA: Diagnosis not present

## 2017-10-02 ENCOUNTER — Other Ambulatory Visit: Payer: Self-pay | Admitting: Endocrinology

## 2017-10-02 DIAGNOSIS — E049 Nontoxic goiter, unspecified: Secondary | ICD-10-CM

## 2017-10-03 DIAGNOSIS — M25511 Pain in right shoulder: Secondary | ICD-10-CM | POA: Diagnosis not present

## 2017-10-14 DIAGNOSIS — M25511 Pain in right shoulder: Secondary | ICD-10-CM | POA: Diagnosis not present

## 2017-10-15 ENCOUNTER — Ambulatory Visit
Admission: RE | Admit: 2017-10-15 | Discharge: 2017-10-15 | Disposition: A | Discharge status: Home / Self Care | Payer: Commercial Managed Care - PPO | Source: Ambulatory Visit | Attending: Endocrinology | Admitting: Endocrinology

## 2017-10-15 DIAGNOSIS — E042 Nontoxic multinodular goiter: Secondary | ICD-10-CM | POA: Diagnosis not present

## 2017-10-15 DIAGNOSIS — E049 Nontoxic goiter, unspecified: Secondary | ICD-10-CM

## 2017-10-17 DIAGNOSIS — M25511 Pain in right shoulder: Secondary | ICD-10-CM | POA: Diagnosis not present

## 2017-10-29 DIAGNOSIS — R82998 Other abnormal findings in urine: Secondary | ICD-10-CM | POA: Diagnosis not present

## 2017-10-29 DIAGNOSIS — E041 Nontoxic single thyroid nodule: Secondary | ICD-10-CM | POA: Diagnosis not present

## 2017-10-29 DIAGNOSIS — Z0000 Encounter for general adult medical examination without abnormal findings: Secondary | ICD-10-CM | POA: Diagnosis not present

## 2017-10-29 DIAGNOSIS — M859 Disorder of bone density and structure, unspecified: Secondary | ICD-10-CM | POA: Diagnosis not present

## 2017-10-30 DIAGNOSIS — E049 Nontoxic goiter, unspecified: Secondary | ICD-10-CM | POA: Diagnosis not present

## 2017-10-30 DIAGNOSIS — E039 Hypothyroidism, unspecified: Secondary | ICD-10-CM | POA: Diagnosis not present

## 2017-11-01 DIAGNOSIS — Z23 Encounter for immunization: Secondary | ICD-10-CM | POA: Diagnosis not present

## 2017-11-03 ENCOUNTER — Other Ambulatory Visit (HOSPITAL_COMMUNITY): Payer: Self-pay | Admitting: Internal Medicine

## 2017-11-03 DIAGNOSIS — Z0000 Encounter for general adult medical examination without abnormal findings: Secondary | ICD-10-CM | POA: Diagnosis not present

## 2017-11-03 DIAGNOSIS — R030 Elevated blood-pressure reading, without diagnosis of hypertension: Secondary | ICD-10-CM | POA: Diagnosis not present

## 2017-11-03 DIAGNOSIS — I340 Nonrheumatic mitral (valve) insufficiency: Secondary | ICD-10-CM

## 2017-11-03 DIAGNOSIS — M25511 Pain in right shoulder: Secondary | ICD-10-CM | POA: Diagnosis not present

## 2017-11-03 DIAGNOSIS — M859 Disorder of bone density and structure, unspecified: Secondary | ICD-10-CM | POA: Diagnosis not present

## 2017-11-03 DIAGNOSIS — Z1389 Encounter for screening for other disorder: Secondary | ICD-10-CM | POA: Diagnosis not present

## 2017-11-10 ENCOUNTER — Ambulatory Visit (HOSPITAL_COMMUNITY): Payer: Commercial Managed Care - PPO | Attending: Cardiovascular Disease

## 2017-11-10 ENCOUNTER — Other Ambulatory Visit: Payer: Self-pay

## 2017-11-10 ENCOUNTER — Other Ambulatory Visit: Payer: Self-pay | Admitting: Obstetrics

## 2017-11-10 DIAGNOSIS — G43909 Migraine, unspecified, not intractable, without status migrainosus: Secondary | ICD-10-CM | POA: Diagnosis not present

## 2017-11-10 DIAGNOSIS — Z1231 Encounter for screening mammogram for malignant neoplasm of breast: Secondary | ICD-10-CM

## 2017-11-10 DIAGNOSIS — I341 Nonrheumatic mitral (valve) prolapse: Secondary | ICD-10-CM | POA: Insufficient documentation

## 2017-11-10 DIAGNOSIS — I340 Nonrheumatic mitral (valve) insufficiency: Secondary | ICD-10-CM | POA: Insufficient documentation

## 2017-11-12 DIAGNOSIS — Z6820 Body mass index (BMI) 20.0-20.9, adult: Secondary | ICD-10-CM | POA: Diagnosis not present

## 2017-11-12 DIAGNOSIS — Z01419 Encounter for gynecological examination (general) (routine) without abnormal findings: Secondary | ICD-10-CM | POA: Diagnosis not present

## 2017-11-18 DIAGNOSIS — Z1212 Encounter for screening for malignant neoplasm of rectum: Secondary | ICD-10-CM | POA: Diagnosis not present

## 2017-12-09 DIAGNOSIS — R030 Elevated blood-pressure reading, without diagnosis of hypertension: Secondary | ICD-10-CM | POA: Diagnosis not present

## 2017-12-09 DIAGNOSIS — T63421A Toxic effect of venom of ants, accidental (unintentional), initial encounter: Secondary | ICD-10-CM | POA: Diagnosis not present

## 2017-12-11 DIAGNOSIS — E039 Hypothyroidism, unspecified: Secondary | ICD-10-CM | POA: Diagnosis not present

## 2017-12-15 DIAGNOSIS — R030 Elevated blood-pressure reading, without diagnosis of hypertension: Secondary | ICD-10-CM | POA: Diagnosis not present

## 2017-12-18 ENCOUNTER — Ambulatory Visit
Admission: RE | Admit: 2017-12-18 | Discharge: 2017-12-18 | Disposition: A | Discharge status: Home / Self Care | Payer: Commercial Managed Care - PPO | Source: Ambulatory Visit | Attending: Obstetrics | Admitting: Obstetrics

## 2017-12-18 DIAGNOSIS — Z1231 Encounter for screening mammogram for malignant neoplasm of breast: Secondary | ICD-10-CM | POA: Diagnosis not present

## 2017-12-30 DIAGNOSIS — R030 Elevated blood-pressure reading, without diagnosis of hypertension: Secondary | ICD-10-CM | POA: Diagnosis not present

## 2018-03-19 ENCOUNTER — Encounter: Payer: Self-pay | Admitting: Cardiology

## 2018-04-06 ENCOUNTER — Ambulatory Visit (INDEPENDENT_AMBULATORY_CARE_PROVIDER_SITE_OTHER): Payer: 59 | Admitting: Cardiology

## 2018-04-06 ENCOUNTER — Encounter (INDEPENDENT_AMBULATORY_CARE_PROVIDER_SITE_OTHER): Payer: Self-pay

## 2018-04-06 ENCOUNTER — Other Ambulatory Visit: Payer: Self-pay | Admitting: *Deleted

## 2018-04-06 ENCOUNTER — Encounter: Payer: Self-pay | Admitting: Cardiology

## 2018-04-06 VITALS — BP 124/66 | HR 82 | Ht 67.0 in | Wt 131.5 lb

## 2018-04-06 DIAGNOSIS — I5189 Other ill-defined heart diseases: Secondary | ICD-10-CM | POA: Diagnosis not present

## 2018-04-06 DIAGNOSIS — I10 Essential (primary) hypertension: Secondary | ICD-10-CM | POA: Diagnosis not present

## 2018-04-06 DIAGNOSIS — I340 Nonrheumatic mitral (valve) insufficiency: Secondary | ICD-10-CM

## 2018-04-06 NOTE — Progress Notes (Signed)
Cardiology Office Note:    Date:  04/06/2018   ID:  Tonya Norton, DOB 02-Feb-1956, MRN [id number]  PCP:  Crist Infante, MD  Cardiologist:  No primary care provider on file.  Electrophysiologist:  None   Referring MD: Crist Infante, MD    History of Present Illness:    Tonya Norton is a 63 y.o. female with a hx of newly diagnosed hypertension who is coming for concern of abnormal echocardiogram.  The patient works as a Photographer, she is only in very good health, she follows regularly with her primary care physician and because of finding of mitral regurgitation she underwent echocardiography in October 2019.  It showed LVEF of 70-62% grade 2 diastolic dysfunction, and only trivial mitral regurgitation. The patient has healthy weight, she eats healthy and walks 4 times a week.  She denies any symptoms of chest pain no shortness of breath with that.  She was placed on blood pressure monitor that showed blood pressure less than 130/80 450% of the time but there were some spikes over 140.  She was placed on losartan 80 mg daily that she tolerates well and her blood pressures now are in the 03/06/2018 range.  She does not have any family history of premature coronary artery disease, she does not smoke.  Past Medical History:  Diagnosis Date  . Chronic fatigue syndrome   . Cystic breast   . DDD (degenerative disc disease)   . Depression   . HSV (herpes simplex virus) infection   . Hypothyroidism   . Migraines    Menstrual  . 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 Mitral valve prolapse   . Nocturia 07/23/2013  . Raynaud's disease   . Rosacea   . Snoring 07/23/2013  . Thyroid nodule     Past Surgical History:  Procedure Laterality Date  . benign right breast biopsy    . BREAST EXCISIONAL BIOPSY Right   . DILATION AND CURETTAGE OF UTERUS    . laser to cervix    . SHOULDER SURGERY Right     Current Medications: Current Meds    Medication Sig  . estradiol (ESTRACE) 0.1 MG/GM vaginal cream Use twice a week at bedtime.  Marland Kitchen FLUoxetine (PROZAC) 20 MG tablet Take 20 mg by mouth daily.   . metroNIDAZOLE (METROCREAM) 0.75 % cream Apply 1 application topically 2 (two) times daily.   Marland Kitchen SYNTHROID 100 MCG tablet 1 tablet. Daily except 1/2 tablet on Sunday  . valsartan (DIOVAN) 80 MG tablet TK 1 T PO QD HS     Allergies:   Patient has no known allergies.   Social History   Socioeconomic History  . Marital status: Married    Spouse name: Herbie Baltimore  . Number of children: 0  . Years of education: College  . Highest education level: Not on file  Occupational History    Employer: [employer]  Social Needs  . Financial resource strain: Not on file  . Food insecurity:    Worry: Not on file    Inability: Not on file  . Transportation needs:    Medical: Not on file    Non-medical: Not on file  Tobacco Use  . Smoking status: Never Smoker  . Smokeless tobacco: Never Used  Substance and Sexual Activity  . Alcohol use: Yes    Comment: socially 2-3 weekly  . Drug use: No  . Sexual activity: Not on file  Lifestyle  .  Physical activity:    Days per week: Not on file    Minutes per session: Not on file  . Stress: Not on file  Relationships  . Social connections:    Talks on phone: Not on file    Gets together: Not on file    Attends religious service: Not on file    Active member of club or organization: Not on file    Attends meetings of clubs or organizations: Not on file    Relationship status: Not on file  Other Topics Concern  . Not on file  Social History Narrative   Patient is married Herbie Baltimore) and lives at home with her husband.   Patient has two step daughters.   Patient is working full-time.   Patient has a Cytogeneticist school)   Patient is right-handed.   Patient drinks two cups of coffee or hot tea daily.     Family History: The patient's family history includes Bladder  Cancer in her maternal grandfather; COPD in her father and mother; Dementia in her father and mother; Emphysema (age of onset: 71) in her mother; Glaucoma in her father and paternal grandfather; Kidney cancer in her maternal grandfather; Lung cancer in her father; Macular degeneration in her maternal grandmother and mother; Melanoma in her father; Neuropathy in her mother; Prostate cancer (age of onset: 7) in her father. There is no history of Breast cancer.  ROS:   Please see the history of present illness.    All other systems reviewed and are negative.  EKGs/Labs/Other Studies Reviewed:    The following studies were reviewed today: EKG shows normal sinus rhythm normal EKG  Echocardiogram -as described below   EKG:  EKG is ordered today.  The ekg ordered today demonstrates normal sinus rhythm normal EKG  Recent Labs: No results found for requested labs within last 8760 hours.  Recent Lipid Panel No results found for: CHOL, TRIG, HDL, CHOLHDL, VLDL, LDLCALC, LDLDIRECT  Physical Exam:    VS:  BP 124/66   Pulse 82   Ht 5\' 7"  (1.702 m)   Wt 131 lb 8 oz (59.6 kg)   SpO2 99%   BMI 20.60 kg/m     Wt Readings from Last 3 Encounters:  04/06/18 131 lb 8 oz (59.6 kg)  07/23/13 128 lb (58.1 kg)     GEN:  Well nourished, well developed in no acute distress HEENT: Normal NECK: No JVD; No carotid bruits LYMPHATICS: No lymphadenopathy CARDIAC: RRR, no murmurs, rubs, gallops RESPIRATORY:  Clear to auscultation without rales, wheezing or rhonchi  ABDOMEN: Soft, non-tender, non-distended MUSCULOSKELETAL:  No edema; No deformity  SKIN: Warm and dry NEUROLOGIC:  Alert and oriented x 3 PSYCHIATRIC:  Normal affect   ASSESSMENT:    1. Essential hypertension   2. Diastolic dysfunction    PLAN:    In order of problems listed above:  1. Abnormal echocardiogram -with grade 2 diastolic dysfunction, I have personally reviewed her echocardiogram, she has normal LV size and function, low  normal left atrial size, she actually has grade 1 diastolic dysfunction with normal filling pressures, we will correct her interpretation, I would recommend to continue treatment with valsartan, her blood pressure is now at goal, I would also focus on ongoing healthy diet and exercise 5 times a week. 2. Hypertension -with normal wall thickness of her left ventricle, also her EKG does not show any LVH, she seems to be on the right track with her diet and exercise. 3. Mitral  regurgitation -only trivial out with no prolapse, no need to do repeat echocardiograms.   Medication Adjustments/Labs and Tests Ordered: Current medicines are reviewed at length with the patient today.  Concerns regarding medicines are outlined above.  Orders Placed This Encounter  Procedures  . EKG 12-Lead   No orders of the defined types were placed in this encounter.   Patient Instructions  Medication Instructions:   Your physician recommends that you continue on your current medications as directed. Please refer to the Current Medication list given to you today.  If you need a refill on your cardiac medications before your next appointment, please call your pharmacy.     Follow-Up: At [facility], you and your health needs are our priority.  As part of our continuing mission to provide you with exceptional heart care, we have created designated Provider Care Teams.  These Care Teams include your primary Cardiologist (physician) and Advanced Practice Providers (APPs -  Physician Assistants and Nurse Practitioners) who all work together to provide you with the care you need, when you need it. You will need a follow up appointment in 12 months Dr Meda Coffee.  Please call our office 2 months in advance to schedule this appointment.  You may see  or one of the following Advanced Practice Providers on your designated Care Team:   Lyda Jester, PA-C Melina Copa, PA-C . Ermalinda Barrios, PA-C        Signed, Ena Dawley, MD  04/06/2018 10:44 AM    [place]

## 2018-04-06 NOTE — Patient Instructions (Signed)
Medication Instructions:   Your physician recommends that you continue on your current medications as directed. Please refer to the Current Medication list given to you today.  If you need a refill on your cardiac medications before your next appointment, please call your pharmacy.     Follow-Up: At [facility], you and your health needs are our priority.  As part of our continuing mission to provide you with exceptional heart care, we have created designated Provider Care Teams.  These Care Teams include your primary Cardiologist (physician) and Advanced Practice Providers (APPs -  Physician Assistants and Nurse Practitioners) who all work together to provide you with the care you need, when you need it. You will need a follow up appointment in 12 months Dr Meda Coffee.  Please call our office 2 months in advance to schedule this appointment.  You may see  or one of the following Advanced Practice Providers on your designated Care Team:   Lyda Jester, PA-C Melina Copa, PA-C . Ermalinda Barrios, PA-C

## 2018-10-05 ENCOUNTER — Other Ambulatory Visit: Payer: Self-pay | Admitting: Endocrinology

## 2018-10-05 DIAGNOSIS — E039 Hypothyroidism, unspecified: Secondary | ICD-10-CM

## 2018-10-21 ENCOUNTER — Ambulatory Visit
Admission: RE | Admit: 2018-10-21 | Discharge: 2018-10-21 | Disposition: A | Discharge status: Home / Self Care | Payer: 59 | Source: Ambulatory Visit | Attending: Endocrinology | Admitting: Endocrinology

## 2018-10-21 DIAGNOSIS — E039 Hypothyroidism, unspecified: Secondary | ICD-10-CM

## 2018-11-24 ENCOUNTER — Other Ambulatory Visit: Payer: Self-pay | Admitting: Obstetrics

## 2018-11-24 DIAGNOSIS — Z1231 Encounter for screening mammogram for malignant neoplasm of breast: Secondary | ICD-10-CM

## 2018-12-14 ENCOUNTER — Telehealth: Payer: Self-pay

## 2018-12-14 DIAGNOSIS — E785 Hyperlipidemia, unspecified: Secondary | ICD-10-CM

## 2018-12-14 NOTE — Telephone Encounter (Signed)
-----   Message from Osvaldo Shipper, [place] sent at 12/14/2018  8:21 AM EST ----- Regarding: Needs New Order for CT Please place order for CT CA Score for patient above for Dr Joylene Draft. Order under Dr. Meda Coffee any cardiologist to read and Dr Joylene Draft to call results to pt.   MRN [id number] Name Tonya Norton, Tonya Norton  DOB 21-Nov-1955 DX Hyperlipidemia   Thank you   Erline Levine

## 2018-12-14 NOTE — Telephone Encounter (Signed)
Calcium score ordered per Dr. Meda Coffee for Dr. Joylene Draft to call patient with results.

## 2019-01-12 ENCOUNTER — Ambulatory Visit
Admission: RE | Admit: 2019-01-12 | Discharge: 2019-01-12 | Disposition: A | Discharge status: Home / Self Care | Payer: 59 | Source: Ambulatory Visit | Attending: Obstetrics | Admitting: Obstetrics

## 2019-01-12 ENCOUNTER — Other Ambulatory Visit: Payer: Self-pay

## 2019-01-12 DIAGNOSIS — Z1231 Encounter for screening mammogram for malignant neoplasm of breast: Secondary | ICD-10-CM

## 2019-03-31 DIAGNOSIS — I509 Heart failure, unspecified: Secondary | ICD-10-CM

## 2019-03-31 DIAGNOSIS — I5189 Other ill-defined heart diseases: Secondary | ICD-10-CM

## 2019-04-07 NOTE — Telephone Encounter (Signed)
Pts echo is scheduled for 04/16/19 at [place]. Pt made aware of appt date and time by [facility] scheduling dept.

## 2019-04-16 ENCOUNTER — Other Ambulatory Visit: Payer: Self-pay

## 2019-04-16 ENCOUNTER — Ambulatory Visit (HOSPITAL_COMMUNITY): Payer: 59 | Attending: Cardiovascular Disease

## 2019-04-16 DIAGNOSIS — I509 Heart failure, unspecified: Secondary | ICD-10-CM | POA: Insufficient documentation

## 2019-04-16 DIAGNOSIS — I5189 Other ill-defined heart diseases: Secondary | ICD-10-CM | POA: Diagnosis not present

## 2019-05-06 ENCOUNTER — Other Ambulatory Visit: Payer: Self-pay

## 2019-05-06 ENCOUNTER — Ambulatory Visit (INDEPENDENT_AMBULATORY_CARE_PROVIDER_SITE_OTHER)
Admission: RE | Admit: 2019-05-06 | Discharge: 2019-05-06 | Disposition: A | Discharge status: Home / Self Care | Payer: 59 | Source: Ambulatory Visit | Attending: Cardiology | Admitting: Cardiology

## 2019-05-06 DIAGNOSIS — E785 Hyperlipidemia, unspecified: Secondary | ICD-10-CM

## 2019-05-11 NOTE — Progress Notes (Signed)
Cardiology Office Note:    Date:  05/12/2019   ID:  Tonya Norton, DOB 09/06/1955, MRN [id number]  PCP:  Crist Infante, MD  Cardiologist:  No primary care provider on file.  Electrophysiologist:  None   Referring MD: Crist Infante, MD   Reason for visit: 1 year follow-up   History of Present Illness:    Tonya Norton is a 64 y.o. female with a hx of newly diagnosed hypertension who is coming for concern of abnormal echocardiogram.  The patient works as a Photographer, she is only in very good health, she follows regularly with her primary care physician and because of finding of mitral regurgitation she underwent echocardiography in October 2019.  It showed LVEF of 123456 grade 2 diastolic dysfunction, and only trivial mitral regurgitation. The patient has healthy weight, she eats healthy and walks 4 times a week.  She denies any symptoms of chest pain no shortness of breath with that.  She was placed on blood pressure monitor that showed blood pressure less than 130/80, 50% of the time but there were some spikes over 140.  She was placed on losartan 80 mg daily that she tolerates well and her blood pressures now are in the 03/06/2018 range.  She does not have any family history of premature coronary artery disease, she does not smoke.  05/12/2019 -the patient is coming after 1 year, she has been doing great and continues to eat healthy and walk or exercise 5-6 times a week.  Her major problem is left knee osteoarthritis.  She denies any chest pain or shortness of breath, no lower extremity edema or claudications.  She brings her most recent lipid results from Dr. Joylene Draft that show HDL 67 LDL 106 and triglycerides 81.   Past Medical History:  Diagnosis Date  . Chronic fatigue syndrome   . Cystic breast   . DDD (degenerative disc disease)   . Depression   . HSV (herpes simplex virus) infection   . Hypothyroidism   . Migraines    Menstrual  . 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 Mitral valve prolapse   . Nocturia 07/23/2013  . Raynaud's disease   . Rosacea   . Snoring 07/23/2013  . Thyroid nodule    Past Surgical History:  Procedure Laterality Date  . benign right breast biopsy    . BREAST EXCISIONAL BIOPSY Right   . DILATION AND CURETTAGE OF UTERUS    . laser to cervix    . SHOULDER SURGERY Right    Current Medications: Current Meds  Medication Sig  . Alpha-Lipoic Acid 200 MG CAPS Take by mouth daily.  . Ascorbic Acid (VITAMIN C) 500 MG CAPS Take by mouth daily.  . cholecalciferol (VITAMIN D3) 25 MCG (1000 UNIT) tablet Take 1,000 Units by mouth daily.  . Coenzyme Q10 200 MG capsule Take 200 mg by mouth daily.  Marland Kitchen estradiol (ESTRACE) 0.1 MG/GM vaginal cream Use twice a week at bedtime.  . fexofenadine (ALLEGRA) 180 MG tablet Take 180 mg by mouth daily.  Marland Kitchen FLUoxetine (PROZAC) 20 MG tablet Take 20 mg by mouth daily.   . fluticasone (FLONASE) 50 MCG/ACT nasal spray Place 2 sprays into both nostrils daily.  Marland Kitchen guaiFENesin (MUCINEX) 600 MG 12 hr tablet Take 600 mg by mouth 2 (two) times daily as needed.  . metroNIDAZOLE (METROCREAM) 0.75 % cream Apply 1 application topically 2 (two) times daily.   . Multiple Vitamins-Minerals (CENTRUM SILVER  50+WOMEN PO) Take by mouth daily.  Marland Kitchen SYNTHROID 100 MCG tablet 1 tablet. Daily except 1/2 tablet on Sunday  . valsartan (DIOVAN) 80 MG tablet TK 1 T PO QD HS     Allergies:   Patient has no known allergies.   Social History   Socioeconomic History  . Marital status: Married    Spouse name: Tonya Norton  . Number of children: 0  . Years of education: College  . Highest education level: Not on file  Occupational History    Employer: [employer]  Tobacco Use  . Smoking status: Never Smoker  . Smokeless tobacco: Never Used  Substance and Sexual Activity  . Alcohol use: Yes    Comment: socially 2-3 weekly  . Drug use: No  . Sexual activity: Not on file  Other  Topics Concern  . Not on file  Social History Narrative   Patient is married Tonya Norton) and lives at home with her husband.   Patient has two step daughters.   Patient is working full-time.   Patient has a Cytogeneticist school)   Patient is right-handed.   Patient drinks two cups of coffee or hot tea daily.   Social Determinants of Health   Financial Resource Strain:   . Difficulty of Paying Living Expenses:   Food Insecurity:   . Worried About Charity fundraiser in the Last Year:   . Arboriculturist in the Last Year:   Transportation Needs:   . Film/video editor (Medical):   Marland Kitchen Lack of Transportation (Non-Medical):   Physical Activity:   . Days of Exercise per Week:   . Minutes of Exercise per Session:   Stress:   . Feeling of Stress :   Social Connections:   . Frequency of Communication with Friends and Family:   . Frequency of Social Gatherings with Friends and Family:   . Attends Religious Services:   . Active Member of Clubs or Organizations:   . Attends Archivist Meetings:   Marland Kitchen Marital Status:      Family History: The patient's family history includes Bladder Cancer in her maternal grandfather; COPD in her father and mother; Dementia in her father and mother; Emphysema (age of onset: 100) in her mother; Glaucoma in her father and paternal grandfather; Kidney cancer in her maternal grandfather; Lung cancer in her father; Macular degeneration in her maternal grandmother and mother; Melanoma in her father; Neuropathy in her mother; Prostate cancer (age of onset: 49) in her father. There is no history of Breast cancer.  ROS:   Please see the history of present illness.    All other systems reviewed and are negative.  EKGs/Labs/Other Studies Reviewed:    The following studies were reviewed today: EKG shows normal sinus rhythm normal EKG  Echocardiogram -April 16, 2019  1. Left ventricular ejection fraction, by estimation, is 60 to 65%. The  left  ventricle has normal function. The left ventricle has no regional  wall motion abnormalities. Left ventricular diastolic parameters are  consistent with Grade II diastolic  dysfunction (pseudonormalization). Elevated left atrial pressure. The  average left ventricular global longitudinal strain is -22.2 %.  2. Right ventricular systolic function is normal. The right ventricular  size is normal. There is normal pulmonary artery systolic pressure. The  estimated right ventricular systolic pressure is 0000000 mmHg.  3. The mitral valve is myxomatous. Mild to moderate mitral valve  regurgitation. No evidence of mitral stenosis.  4. The  aortic valve is normal in structure. Aortic valve regurgitation is  not visualized. No aortic stenosis is present.  5. The inferior vena cava is normal in size with greater than 50%  respiratory variability, suggesting right atrial pressure of 3 mmHg.    EKG:  EKG is ordered today.  The ekg ordered today demonstrates normal sinus rhythm normal EKG  Recent Labs: No results found for requested labs within last 8760 hours.  Recent Lipid Panel No results found for: CHOL, TRIG, HDL, CHOLHDL, VLDL, LDLCALC, LDLDIRECT  Physical Exam:    VS:  BP 116/72   Pulse 71   Ht 5\' 7"  (1.702 m)   Wt 130 lb 9.6 oz (59.2 kg)   SpO2 97%   BMI 20.45 kg/m     Wt Readings from Last 3 Encounters:  05/12/19 130 lb 9.6 oz (59.2 kg)  04/06/18 131 lb 8 oz (59.6 kg)  07/23/13 128 lb (58.1 kg)     GEN:  Well nourished, well developed in no acute distress HEENT: Normal NECK: No JVD; No carotid bruits LYMPHATICS: No lymphadenopathy CARDIAC: RRR, no murmurs, rubs, gallops RESPIRATORY:  Clear to auscultation without rales, wheezing or rhonchi  ABDOMEN: Soft, non-tender, non-distended MUSCULOSKELETAL:  No edema; No deformity  SKIN: Warm and dry NEUROLOGIC:  Alert and oriented x 3 PSYCHIATRIC:  Normal affect   ASSESSMENT:    1. Hyperlipidemia, unspecified hyperlipidemia  type   2. Essential hypertension    PLAN:    In order of problems listed above:  1. Abnormal echocardiogram -with grade 2 diastolic dysfunction, repeat echocardiogram this year showed LVEF 123456, grade 2 diastolic dysfunction, normal strain.  She has no signs of fluid overload, will continue on risk factor modifications.  2. Hypertension -with normal wall thickness of her left ventricle, also her EKG does not show any LVH, she seems to be on the right track with her diet and exercise. 3. Mitral regurgitation -appears myxomatous, with mild to moderate mitral regurgitation. 4. Coronary calcification -with calcium score of 16.9. This was percentile 38 for age and sex matched control. There is mild aortic root calcification. We will obtain lipids and CMP today. 5. Hyperlipidemia -LDL 106 however evidence of mild CAD on calcium scoring, we will add red yeast rice 600 mg daily.  Medication Adjustments/Labs and Tests Ordered: Current medicines are reviewed at length with the patient today.  Concerns regarding medicines are outlined above.  Orders Placed This Encounter  Procedures  . EKG 12-Lead   Meds ordered this encounter  Medications  . Red Yeast Rice 600 MG TABS    Sig: Take 1 tablet (600 mg total) by mouth daily.    Dispense:  90 tablet    Refill:  3   Patient Instructions  Medication Instructions:   START TAKING RED YEAST RICE 600 MG BY MOUTH DAILY  *If you need a refill on your cardiac medications before your next appointment, please call your pharmacy*   Follow-Up: At [facility], you and your health needs are our priority.  As part of our continuing mission to provide you with exceptional heart care, we have created designated Provider Care Teams.  These Care Teams include your primary Cardiologist (physician) and Advanced Practice Providers (APPs -  Physician Assistants and Nurse Practitioners) who all work together to provide you with the care you need, when you need  it.  We recommend signing up for the patient portal called "MyChart".  Sign up information is provided on this After Visit Summary.  MyChart  is used to connect with patients for Virtual Visits (Telemedicine).  Patients are able to view lab/test results, encounter notes, upcoming appointments, etc.  Non-urgent messages can be sent to your provider as well.   To learn more about what you can do with MyChart, go to [email or web address].    Your next appointment:   12 month(s)  The format for your next appointment:   In Person  Provider:   Ena Dawley, MD     Signed, Ena Dawley, MD  05/12/2019 8:38 AM    [place]

## 2019-05-12 ENCOUNTER — Other Ambulatory Visit: Payer: Self-pay

## 2019-05-12 ENCOUNTER — Encounter: Payer: Self-pay | Admitting: Cardiology

## 2019-05-12 ENCOUNTER — Ambulatory Visit (INDEPENDENT_AMBULATORY_CARE_PROVIDER_SITE_OTHER): Payer: 59 | Admitting: Cardiology

## 2019-05-12 VITALS — BP 116/72 | HR 71 | Ht 67.0 in | Wt 130.6 lb

## 2019-05-12 DIAGNOSIS — I5189 Other ill-defined heart diseases: Secondary | ICD-10-CM

## 2019-05-12 DIAGNOSIS — E785 Hyperlipidemia, unspecified: Secondary | ICD-10-CM

## 2019-05-12 DIAGNOSIS — I10 Essential (primary) hypertension: Secondary | ICD-10-CM

## 2019-05-12 MED ORDER — RED YEAST RICE 600 MG PO TABS: 600.0000 mg | ORAL_TABLET | Freq: Every day | ORAL | 3 refills | Status: DC

## 2019-05-12 NOTE — Patient Instructions (Signed)
Medication Instructions:   START TAKING RED YEAST RICE 600 MG BY MOUTH DAILY  *If you need a refill on your cardiac medications before your next appointment, please call your pharmacy*   Follow-Up: At [facility], you and your health needs are our priority.  As part of our continuing mission to provide you with exceptional heart care, we have created designated Provider Care Teams.  These Care Teams include your primary Cardiologist (physician) and Advanced Practice Providers (APPs -  Physician Assistants and Nurse Practitioners) who all work together to provide you with the care you need, when you need it.  We recommend signing up for the patient portal called "MyChart".  Sign up information is provided on this After Visit Summary.  MyChart is used to connect with patients for Virtual Visits (Telemedicine).  Patients are able to view lab/test results, encounter notes, upcoming appointments, etc.  Non-urgent messages can be sent to your provider as well.   To learn more about what you can do with MyChart, go to [email or web address].    Your next appointment:   12 month(s)  The format for your next appointment:   In Person  Provider:   Ena Dawley, MD

## 2019-11-30 ENCOUNTER — Other Ambulatory Visit: Payer: Self-pay | Admitting: Obstetrics

## 2019-11-30 DIAGNOSIS — Z1231 Encounter for screening mammogram for malignant neoplasm of breast: Secondary | ICD-10-CM

## 2020-02-10 ENCOUNTER — Ambulatory Visit: Payer: 59

## 2020-03-07 ENCOUNTER — Other Ambulatory Visit: Payer: Self-pay

## 2020-03-07 ENCOUNTER — Ambulatory Visit
Admission: RE | Admit: 2020-03-07 | Discharge: 2020-03-07 | Disposition: A | Discharge status: Home / Self Care | Payer: Medicare Other | Source: Ambulatory Visit | Attending: Obstetrics | Admitting: Obstetrics

## 2020-03-07 DIAGNOSIS — Z1231 Encounter for screening mammogram for malignant neoplasm of breast: Secondary | ICD-10-CM

## 2020-04-12 ENCOUNTER — Encounter: Payer: Self-pay | Admitting: Cardiology

## 2020-04-12 ENCOUNTER — Telehealth: Payer: Self-pay | Admitting: *Deleted

## 2020-04-12 ENCOUNTER — Ambulatory Visit (INDEPENDENT_AMBULATORY_CARE_PROVIDER_SITE_OTHER): Payer: Medicare Other | Admitting: Cardiology

## 2020-04-12 ENCOUNTER — Other Ambulatory Visit: Payer: Self-pay

## 2020-04-12 VITALS — BP 118/70 | HR 66 | Ht 67.0 in | Wt 130.2 lb

## 2020-04-12 DIAGNOSIS — I10 Essential (primary) hypertension: Secondary | ICD-10-CM | POA: Diagnosis not present

## 2020-04-12 DIAGNOSIS — E785 Hyperlipidemia, unspecified: Secondary | ICD-10-CM | POA: Diagnosis not present

## 2020-04-12 DIAGNOSIS — I5189 Other ill-defined heart diseases: Secondary | ICD-10-CM | POA: Diagnosis not present

## 2020-04-12 NOTE — Patient Instructions (Signed)
Medication Instructions:   Your physician recommends that you continue on your current medications as directed. Please refer to the Current Medication list given to you today.  *If you need a refill on your cardiac medications before your next appointment, please call your pharmacy*   Follow-Up:  PER DR. Meda Coffee, PATIENT WOULD LIKE TO ESTABLISH CARE WITH DR. Pemberton AT OUR [facility] OFFICE IN ONE YEAR.  PATIENT IS AWARE THAT A MESSAGE WILL NEED TO BE SENT TO DR. [place] AND NURSE TO AGREE TO TAKE ON HER CARE.    Your physician wants you to follow-up in: [place] will receive a reminder letter in the mail two months in advance. If you don't receive a letter, please call our office to schedule the follow-up appointment.

## 2020-04-12 NOTE — Telephone Encounter (Signed)
Dr. Oval Linsey and Rip Harbour, we saw this pt in clinic this morning.  Dr. Meda Coffee wants her to follow-up in one year in the clinic.  Pt and her Husband are both Dr. Francesca Oman pts, and would like to establish care with you, if you accept.  Pt is aware that there will have to be an agreement on your part to accept her care and her Husband's care, before scheduling.  Will send in separate telephone note on her Husband, Mr. Kuehnel. Please advise.  Thanks!

## 2020-04-12 NOTE — Progress Notes (Signed)
Cardiology Office Note:    Date:  04/12/2020   ID:  CLEASTER Norton, DOB [birth year], MRN [id number]  PCP:  Crist Infante, MD  Cardiologist:  No primary care provider on file.  Electrophysiologist:  None   Referring MD: Crist Infante, MD   Reason for visit: 1 year follow-up   History of Present Illness:    Tonya Norton is a 65 y.o. female with a hx of newly diagnosed hypertension who is coming for concern of abnormal echocardiogram.  The patient works as a Photographer, she is only in very good health, she follows regularly with her primary care physician and because of finding of mitral regurgitation she underwent echocardiography in October 2019.  It showed LVEF of 35-00% grade 2 diastolic dysfunction, and only trivial mitral regurgitation. The patient has healthy weight, she eats healthy and walks 4 times a week.  She denies any symptoms of chest pain no shortness of breath with that.  She was placed on blood pressure monitor that showed blood pressure less than 130/80, 50% of the time but there were some spikes over 140.  She was placed on losartan 80 mg daily that she tolerates well and her blood pressures now are in the 03/06/2018 range.  She does not have any family history of premature coronary artery disease, she does not smoke.  The patient is coming after a year, she has been doing great, she exercises almost every day, eats healthy and is very compliant with her meds, she has no lower extremity edema orthopnea proximal nocturnal dyspnea.  She is tolerating rosuvastatin very well.   Past Medical History:  Diagnosis Date  . Chronic fatigue syndrome   . Cystic breast   . DDD (degenerative disc disease)   . Depression   . HSV (herpes simplex virus) infection   . Hypothyroidism   . Migraines    Menstrual  . 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 Mitral valve prolapse   . Nocturia 07/23/2013  . Raynaud's disease   . Rosacea   .  Snoring 07/23/2013  . Thyroid nodule    Past Surgical History:  Procedure Laterality Date  . benign right breast biopsy    . BREAST EXCISIONAL BIOPSY Right   . DILATION AND CURETTAGE OF UTERUS    . laser to cervix    . SHOULDER SURGERY Right    Current Medications: Current Meds  Medication Sig  . Alpha-Lipoic Acid 200 MG CAPS Take by mouth daily.  . Ascorbic Acid (VITAMIN C) 500 MG CAPS Take by mouth daily.  . cholecalciferol (VITAMIN D3) 25 MCG (1000 UNIT) tablet Take 1,000 Units by mouth daily.  . Coenzyme Q10 200 MG capsule Take 200 mg by mouth daily.  Marland Kitchen estradiol (ESTRACE) 0.1 MG/GM vaginal cream Use twice a week at bedtime.  . fexofenadine (ALLEGRA) 180 MG tablet Take 180 mg by mouth daily.  Marland Kitchen FLUoxetine (PROZAC) 20 MG tablet Take 20 mg by mouth daily.   . fluticasone (FLONASE) 50 MCG/ACT nasal spray Place 2 sprays into both nostrils daily.  Marland Kitchen guaiFENesin (MUCINEX) 600 MG 12 hr tablet Take 600 mg by mouth 2 (two) times daily as needed.  . metroNIDAZOLE (METROCREAM) 0.75 % cream Apply 1 application topically 2 (two) times daily.   . Multiple Vitamins-Minerals (CENTRUM SILVER 50+WOMEN PO) Take by mouth daily.  . rosuvastatin (CRESTOR) 10 MG tablet Take 10 mg by mouth daily.  Marland Kitchen SYNTHROID 100 MCG tablet 1 tablet.  6 days a week  . valsartan (DIOVAN) 80 MG tablet TK 1 T PO QD HS     Allergies:   Patient has no known allergies.   Social History   Socioeconomic History  . Marital status: Married    Spouse name: Herbie Baltimore  . Number of children: 0  . Years of education: College  . Highest education level: Not on file  Occupational History    Employer: [employer]  Tobacco Use  . Smoking status: Never Smoker  . Smokeless tobacco: Never Used  Vaping Use  . Vaping Use: Never used  Substance and Sexual Activity  . Alcohol use: Yes    Comment: socially 2-3 weekly  . Drug use: No  . Sexual activity: Not on file  Other Topics Concern  . Not on file  Social  History Narrative   Patient is married Herbie Baltimore) and lives at home with her husband.   Patient has two step daughters.   Patient is working full-time.   Patient has a Cytogeneticist school)   Patient is right-handed.   Patient drinks two cups of coffee or hot tea daily.   Social Determinants of Health   Financial Resource Strain: Not on file  Food Insecurity: Not on file  Transportation Needs: Not on file  Physical Activity: Not on file  Stress: Not on file  Social Connections: Not on file     Family History: The patient's family history includes Bladder Cancer in her maternal grandfather; COPD in her father and mother; Dementia in her father and mother; Emphysema (age of onset: 77) in her mother; Glaucoma in her father and paternal grandfather; Kidney cancer in her maternal grandfather; Lung cancer in her father; Macular degeneration in her maternal grandmother and mother; Melanoma in her father; Neuropathy in her mother; Prostate cancer (age of onset: 66) in her father. There is no history of Breast cancer.  ROS:   Please see the history of present illness.    All other systems reviewed and are negative.  EKGs/Labs/Other Studies Reviewed:    The following studies were reviewed today: EKG shows normal sinus rhythm normal EKG  Echocardiogram -April 16, 2019  1. Left ventricular ejection fraction, by estimation, is 60 to 65%. The  left ventricle has normal function. The left ventricle has no regional  wall motion abnormalities. Left ventricular diastolic parameters are  consistent with Grade II diastolic  dysfunction (pseudonormalization). Elevated left atrial pressure. The  average left ventricular global longitudinal strain is -22.2 %.  2. Right ventricular systolic function is normal. The right ventricular  size is normal. There is normal pulmonary artery systolic pressure. The  estimated right ventricular systolic pressure is 40.9 mmHg.  3. The mitral valve is  myxomatous. Mild to moderate mitral valve  regurgitation. No evidence of mitral stenosis.  4. The aortic valve is normal in structure. Aortic valve regurgitation is  not visualized. No aortic stenosis is present.  5. The inferior vena cava is normal in size with greater than 50%  respiratory variability, suggesting right atrial pressure of 3 mmHg.    EKG:  EKG is ordered today.  The ekg ordered today demonstrates normal sinus rhythm normal EKG, this is unchanged from prior and was personally reviewed.  Recent Labs: No results found for requested labs within last 8760 hours.  Recent Lipid Panel No results found for: CHOL, TRIG, HDL, CHOLHDL, VLDL, LDLCALC, LDLDIRECT  Physical Exam:    VS:  BP 118/70   Pulse 66  Ht 5\' 7"  (1.702 m)   Wt 130 lb 3.2 oz (59.1 kg)   SpO2 97%   BMI 20.39 kg/m     Wt Readings from Last 3 Encounters:  04/12/20 130 lb 3.2 oz (59.1 kg)  05/12/19 130 lb 9.6 oz (59.2 kg)  04/06/18 131 lb 8 oz (59.6 kg)     GEN:  Well nourished, well developed in no acute distress HEENT: Normal NECK: No JVD; No carotid bruits LYMPHATICS: No lymphadenopathy CARDIAC: RRR, no murmurs, rubs, gallops RESPIRATORY:  Clear to auscultation without rales, wheezing or rhonchi  ABDOMEN: Soft, non-tender, non-distended MUSCULOSKELETAL:  No edema; No deformity  SKIN: Warm and dry NEUROLOGIC:  Alert and oriented x 3 PSYCHIATRIC:  Normal affect    ASSESSMENT:    1. Hyperlipidemia, unspecified hyperlipidemia type   2. Essential hypertension   3. Diastolic dysfunction    PLAN:    In order of problems listed above:  1. Abnormal echocardiogram -with grade 2 diastolic dysfunction, repeat echocardiogram this year showed LVEF 86-75%, grade 2 diastolic dysfunction, normal strain -22%.  She has no signs of fluid overload, will continue on risk factor modifications.  Functional class NYHA I, she exercises daily and eats very healthy and is completely asymptomatic. 2. Hypertension  -perfectly controlled, she brings her diary and 95% of her blood pressures are at goal.   3. Mitral regurgitation -appears myxomatous, with mild to moderate mitral regurgitation was stable on echo in April 2021.. 4. Coronary calcification -with calcium score of 16.9. This was percentile 58 for age and sex matched control. There is mild aortic root calcification.  She was started on red yeast rest, however it did not improve her lipid panel, she was then started on rosuvastatin 10 mg daily that she tolerates well, her most recent lipids were HDL 65, LDL 57, triglycerides 79 and her apolipoprotein B decreased from 97->59. 5. Hyperlipidemia -as above, LFTs are normal.  Medication Adjustments/Labs and Tests Ordered: Current medicines are reviewed at length with the patient today.  Concerns regarding medicines are outlined above.  Orders Placed This Encounter  Procedures  . EKG 12-Lead   No orders of the defined types were placed in this encounter.  There are no Patient Instructions on file for this visit.   Signed, Ena Dawley, MD  04/12/2020 8:51 AM    [facility]

## 2020-04-18 NOTE — Telephone Encounter (Signed)
Happy to see them both.

## 2020-04-18 NOTE — Telephone Encounter (Signed)
Recall placed for one year OV with Dr. Oval Linsey at our [place] location.

## 2020-05-19 ENCOUNTER — Ambulatory Visit: Payer: Medicare Other | Attending: Internal Medicine

## 2020-05-19 ENCOUNTER — Other Ambulatory Visit: Payer: Self-pay

## 2020-05-19 DIAGNOSIS — Z23 Encounter for immunization: Secondary | ICD-10-CM

## 2020-05-19 NOTE — Progress Notes (Signed)
   Covid-19 Vaccination Clinic  Name:  Tonya Norton    MRN: [id number] DOB: 04/02/1955  05/19/2020  Tonya Norton was observed post Covid-19 immunization for 15 minutes without incident. She was provided with Vaccine Information Sheet and instruction to access the V-Safe system.   Tonya Norton was instructed to call 911 with any severe reactions post vaccine: Marland Kitchen Difficulty breathing  . Swelling of face and throat  . A fast heartbeat  . A bad rash all over body  . Dizziness and weakness   Immunizations Administered    Name Date Dose VIS Date Route   Moderna Covid-19 Booster Vaccine 05/19/2020 10:08 AM 0.25 mL 11/24/2019 Intramuscular   Manufacturer: Moderna   Lot: [id number]   [place]: [id number]

## 2020-05-22 ENCOUNTER — Other Ambulatory Visit (HOSPITAL_BASED_OUTPATIENT_CLINIC_OR_DEPARTMENT_OTHER): Payer: Self-pay

## 2020-05-22 MED ORDER — COVID-19 MRNA VACC (MODERNA) 100 MCG/0.5ML IM SUSP
INTRAMUSCULAR | 0 refills | Status: DC
  Filled 2020-05-22: qty 0.25, 1d supply, fill #0

## 2020-05-31 ENCOUNTER — Emergency Department (HOSPITAL_COMMUNITY): Payer: Medicare Other

## 2020-05-31 ENCOUNTER — Observation Stay (HOSPITAL_COMMUNITY): Payer: Medicare Other

## 2020-05-31 ENCOUNTER — Observation Stay (HOSPITAL_COMMUNITY)
Admission: EM | Admit: 2020-05-31 | Discharge: 2020-06-01 | Disposition: A | Discharge status: Home / Self Care | Payer: Medicare Other | Attending: Internal Medicine | Admitting: Internal Medicine

## 2020-05-31 ENCOUNTER — Encounter (HOSPITAL_COMMUNITY): Payer: Self-pay | Admitting: *Deleted

## 2020-05-31 DIAGNOSIS — Z20822 Contact with and (suspected) exposure to covid-19: Secondary | ICD-10-CM | POA: Insufficient documentation

## 2020-05-31 DIAGNOSIS — G454 Transient global amnesia: Principal | ICD-10-CM | POA: Insufficient documentation

## 2020-05-31 DIAGNOSIS — I771 Stricture of artery: Secondary | ICD-10-CM | POA: Insufficient documentation

## 2020-05-31 DIAGNOSIS — G9389 Other specified disorders of brain: Secondary | ICD-10-CM | POA: Diagnosis not present

## 2020-05-31 DIAGNOSIS — E039 Hypothyroidism, unspecified: Secondary | ICD-10-CM | POA: Insufficient documentation

## 2020-05-31 DIAGNOSIS — I10 Essential (primary) hypertension: Secondary | ICD-10-CM | POA: Insufficient documentation

## 2020-05-31 DIAGNOSIS — R4182 Altered mental status, unspecified: Secondary | ICD-10-CM | POA: Diagnosis not present

## 2020-05-31 DIAGNOSIS — R519 Headache, unspecified: Secondary | ICD-10-CM | POA: Insufficient documentation

## 2020-05-31 DIAGNOSIS — Y900 Blood alcohol level of less than 20 mg/100 ml: Secondary | ICD-10-CM | POA: Insufficient documentation

## 2020-05-31 DIAGNOSIS — Z886 Allergy status to analgesic agent status: Secondary | ICD-10-CM | POA: Diagnosis not present

## 2020-05-31 DIAGNOSIS — R2681 Unsteadiness on feet: Secondary | ICD-10-CM | POA: Diagnosis not present

## 2020-05-31 DIAGNOSIS — Z79899 Other long term (current) drug therapy: Secondary | ICD-10-CM | POA: Insufficient documentation

## 2020-05-31 DIAGNOSIS — R41840 Attention and concentration deficit: Secondary | ICD-10-CM | POA: Diagnosis not present

## 2020-05-31 DIAGNOSIS — E785 Hyperlipidemia, unspecified: Secondary | ICD-10-CM | POA: Diagnosis not present

## 2020-05-31 LAB — PROTIME-INR
INR: 1 (ref 0.8–1.2)
Prothrombin Time: 13.1 seconds (ref 11.4–15.2)

## 2020-05-31 LAB — URINALYSIS, ROUTINE W REFLEX MICROSCOPIC
Bilirubin Urine: NEGATIVE
Glucose, UA: NEGATIVE mg/dL
Hgb urine dipstick: NEGATIVE
Ketones, ur: 20 mg/dL — AB
Leukocytes,Ua: NEGATIVE
Nitrite: NEGATIVE
Protein, ur: NEGATIVE mg/dL
Specific Gravity, Urine: 1.015 (ref 1.005–1.030)
pH: 6 (ref 5.0–8.0)

## 2020-05-31 LAB — CBC
HCT: 40.9 % (ref 36.0–46.0)
Hemoglobin: 13.6 g/dL (ref 12.0–15.0)
MCH: 31.1 pg (ref 26.0–34.0)
MCHC: 33.3 g/dL (ref 30.0–36.0)
MCV: 93.4 fL (ref 80.0–100.0)
Platelets: 243 10*3/uL (ref 150–400)
RBC: 4.38 MIL/uL (ref 3.87–5.11)
RDW: 12.8 % (ref 11.5–15.5)
WBC: 10.2 10*3/uL (ref 4.0–10.5)
nRBC: 0 % (ref 0.0–0.2)

## 2020-05-31 LAB — RAPID URINE DRUG SCREEN, HOSP PERFORMED
Amphetamines: NOT DETECTED
Barbiturates: NOT DETECTED
Benzodiazepines: NOT DETECTED
Cocaine: NOT DETECTED
Opiates: NOT DETECTED
Tetrahydrocannabinol: NOT DETECTED

## 2020-05-31 LAB — I-STAT CHEM 8, ED
BUN: 18 mg/dL (ref 8–23)
Calcium, Ion: 1.11 mmol/L — ABNORMAL LOW (ref 1.15–1.40)
Chloride: 105 mmol/L (ref 98–111)
Creatinine, Ser: 0.8 mg/dL (ref 0.44–1.00)
Glucose, Bld: 89 mg/dL (ref 70–99)
HCT: 40 % (ref 36.0–46.0)
Hemoglobin: 13.6 g/dL (ref 12.0–15.0)
Potassium: 3.5 mmol/L (ref 3.5–5.1)
Sodium: 136 mmol/L (ref 135–145)
TCO2: 19 mmol/L — ABNORMAL LOW (ref 22–32)

## 2020-05-31 LAB — COMPREHENSIVE METABOLIC PANEL
ALT: 22 U/L (ref 0–44)
AST: 31 U/L (ref 15–41)
Albumin: 4.2 g/dL (ref 3.5–5.0)
Alkaline Phosphatase: 73 U/L (ref 38–126)
Anion gap: 13 (ref 5–15)
BUN: 15 mg/dL (ref 8–23)
CO2: 20 mmol/L — ABNORMAL LOW (ref 22–32)
Calcium: 9.4 mg/dL (ref 8.9–10.3)
Chloride: 102 mmol/L (ref 98–111)
Creatinine, Ser: 0.93 mg/dL (ref 0.44–1.00)
GFR, Estimated: >60 mL/min (ref >60)
Glucose, Bld: 95 mg/dL (ref 70–99)
Potassium: 3.5 mmol/L (ref 3.5–5.1)
Sodium: 135 mmol/L (ref 135–145)
Total Bilirubin: 0.8 mg/dL (ref 0.3–1.2)
Total Protein: 7.6 g/dL (ref 6.5–8.1)

## 2020-05-31 LAB — TSH: TSH: 0.766 u[IU]/mL (ref 0.350–4.500)

## 2020-05-31 LAB — DIFFERENTIAL
Abs Immature Granulocytes: 0.04 10*3/uL (ref 0.00–0.07)
Basophils Absolute: 0 10*3/uL (ref 0.0–0.1)
Basophils Relative: 0 %
Eosinophils Absolute: 0 10*3/uL (ref 0.0–0.5)
Eosinophils Relative: 0 %
Immature Granulocytes: 0 %
Lymphocytes Relative: 13 %
Lymphs Abs: 1.4 10*3/uL (ref 0.7–4.0)
Monocytes Absolute: 0.6 10*3/uL (ref 0.1–1.0)
Monocytes Relative: 6 %
Neutro Abs: 8.2 10*3/uL — ABNORMAL HIGH (ref 1.7–7.7)
Neutrophils Relative %: 81 %

## 2020-05-31 LAB — RESP PANEL BY RT-PCR (FLU A&B, COVID) ARPGX2
Influenza A by PCR: NEGATIVE
Influenza B by PCR: NEGATIVE
SARS Coronavirus 2 by RT PCR: NEGATIVE

## 2020-05-31 LAB — APTT: aPTT: 28 seconds (ref 24–36)

## 2020-05-31 MED ORDER — STROKE: EARLY STAGES OF RECOVERY BOOK
Freq: Once | Status: AC
  Filled 2020-05-31: qty 1

## 2020-05-31 MED ORDER — FLUOXETINE HCL 20 MG PO CAPS
20.0000 mg | ORAL_CAPSULE | Freq: Every day | ORAL | Status: DC
  Administered 2020-06-01: 20 mg via ORAL
  Filled 2020-05-31 (×2): qty 1

## 2020-05-31 MED ORDER — ACETAMINOPHEN 325 MG PO TABS
650.0000 mg | ORAL_TABLET | ORAL | Status: DC | PRN
  Administered 2020-06-01: 650 mg via ORAL
  Filled 2020-05-31: qty 2

## 2020-05-31 MED ORDER — LORAZEPAM 2 MG/ML IJ SOLN
1.0000 mg | Freq: Once | INTRAMUSCULAR | Status: AC
  Administered 2020-05-31: 1 mg via INTRAVENOUS
  Filled 2020-05-31: qty 1

## 2020-05-31 MED ORDER — ACETAMINOPHEN 650 MG RE SUPP: 650.0000 mg | RECTAL | Status: DC | PRN

## 2020-05-31 MED ORDER — ACETAMINOPHEN 500 MG PO TABS
1000.0000 mg | ORAL_TABLET | Freq: Once | ORAL | Status: AC
  Administered 2020-05-31: 1000 mg via ORAL
  Filled 2020-05-31: qty 2

## 2020-05-31 MED ORDER — GADOBUTROL 1 MMOL/ML IV SOLN
5.5000 mL | Freq: Once | INTRAVENOUS | Status: AC | PRN
  Administered 2020-05-31: 5.5 mL via INTRAVENOUS

## 2020-05-31 MED ORDER — FLUTICASONE PROPIONATE 50 MCG/ACT NA SUSP
2.0000 | Freq: Every day | NASAL | Status: DC
  Administered 2020-06-01: 2 via NASAL
  Filled 2020-05-31: qty 16

## 2020-05-31 MED ORDER — LEVOTHYROXINE SODIUM 100 MCG PO TABS
100.0000 ug | ORAL_TABLET | ORAL | Status: DC
  Administered 2020-06-01: 100 ug via ORAL
  Filled 2020-05-31: qty 1

## 2020-05-31 MED ORDER — ACETAMINOPHEN 160 MG/5ML PO SOLN: 650.0000 mg | ORAL | Status: DC | PRN

## 2020-05-31 MED ORDER — ENOXAPARIN SODIUM 40 MG/0.4ML ~~LOC~~ SOLN
40.0000 mg | SUBCUTANEOUS | Status: DC
  Administered 2020-05-31: 40 mg via SUBCUTANEOUS
  Filled 2020-05-31: qty 0.4

## 2020-05-31 MED ORDER — IRBESARTAN 150 MG PO TABS
75.0000 mg | ORAL_TABLET | Freq: Every day | ORAL | Status: DC
  Administered 2020-06-01: 75 mg via ORAL
  Filled 2020-05-31: qty 1

## 2020-05-31 MED ORDER — ROSUVASTATIN CALCIUM 5 MG PO TABS
10.0000 mg | ORAL_TABLET | Freq: Every day | ORAL | Status: DC
  Administered 2020-06-01: 10 mg via ORAL
  Filled 2020-05-31: qty 2

## 2020-05-31 MED ORDER — THIAMINE HCL 100 MG/ML IJ SOLN
500.0000 mg | Freq: Once | INTRAVENOUS | Status: AC
  Administered 2020-05-31: 500 mg via INTRAVENOUS
  Filled 2020-05-31: qty 5

## 2020-05-31 NOTE — Consult Note (Addendum)
Neurology Consultation  Reason for Consult: loss of memory Referring Physician: Hazle Nordmann, MD  CC: loss of memory  History is obtained from: female at the bedside  HPI: DAISY LITES is a 65 y.o. female with a PMHx of HTN, chronic fatigue syndrome, depression, hypothyroidism, MHA, MVP and Raynauld's. When at home this am, her husband passed out and she called 911. She followed ambulance in her car. When she arrived in ED, she was noted to be confused by staff and visitors and kept asking what happened. Her husband was taken emergently to the cath lab for a PPM. She and he decided it would be best for her to check into ED and be evaluated.   Patient has never had this happen before. She has no history of stroke, TBI, head bleed, or seizures. No FMHx of seizures.   When NP enters ED room, patient is teary eyed lying on stretcher, dressed in her own clothes. After NP introduces herself, patient asks what is going on and states that she can not remember what happened. After just one minute and every 2-3 minutes after that, patient will ask what happened, where is her husband, how did she get here and cries again about not being able to remember. Even when reminders given frequently about her husband and events, she again asks over and over.   She has no HA associated with this event. She is unable to remember calling 911, driving here. Can not tell NP what she had for breakfast this a.m. or events of last pm prior to bedtime. When asked what is the last thing she can remember, she shakes her head and states she can not remember anything. She has had no speech issues. Her vision is normal. No extremity weakness. No facial droop. She is quick to follow commands and no neurological deficit is noted.   In review of past chart notes, patient is very healthy except for HTN which is controlled by medications. She does not have DM II but does have HLD on Crestor 10mg  po qd. Last cardiology note stated her  lipids looked good.   Neurology is asked to consult due to her memory issues.    ROS: A robust ROS was performed and is negative except as noted in the HPI.   Past Medical History:  Diagnosis Date  . Chronic fatigue syndrome   . Cystic breast   . DDD (degenerative disc disease)   . Depression   . HSV (herpes simplex virus) infection   . Hypothyroidism   . Migraines    Menstrual  . 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 Mitral valve prolapse   . Nocturia 07/23/2013  . Raynaud's disease   . Rosacea   . Snoring 07/23/2013  . Thyroid nodule     Family History  Problem Relation Age of Onset  . Prostate cancer Father 68       3/15  . Glaucoma Father   . COPD Father   . Lung cancer Father   . Dementia Father   . Melanoma Father   . Emphysema Mother 6       1/19  . Neuropathy Mother   . COPD Mother   . Dementia Mother   . Macular degeneration Mother   . Glaucoma Paternal Grandfather   . Macular degeneration Maternal Grandmother   . Bladder Cancer Maternal Grandfather   . Kidney cancer Maternal Grandfather   . Breast cancer Neg Hx  Social History:   reports that she has never smoked. She has never used smokeless tobacco. She reports current alcohol use. She reports that she does not use drugs.  Medications  Current Facility-Administered Medications:  .  thiamine 500mg  in normal saline (59ml) IVPB, 500 mg, Intravenous, Once, [place], Karsten Fells, NP  Current Outpatient Medications:  .  Alpha-Lipoic Acid 200 MG CAPS, Take by mouth daily., Disp: , Rfl:  .  Ascorbic Acid (VITAMIN C) 500 MG CAPS, Take by mouth daily., Disp: , Rfl:  .  cholecalciferol (VITAMIN D3) 25 MCG (1000 UNIT) tablet, Take 1,000 Units by mouth daily., Disp: , Rfl:  .  Coenzyme Q10 200 MG capsule, Take 200 mg by mouth daily., Disp: , Rfl:  .  COVID-19 mRNA vaccine, Moderna, 100 MCG/0.5ML injection, Inject into the muscle., Disp: 0.25 mL, Rfl: 0 .  estradiol  (ESTRACE) 0.1 MG/GM vaginal cream, Use twice a week at bedtime., Disp: , Rfl:  .  fexofenadine (ALLEGRA) 180 MG tablet, Take 180 mg by mouth daily., Disp: , Rfl:  .  FLUoxetine (PROZAC) 20 MG tablet, Take 20 mg by mouth daily. , Disp: , Rfl:  .  fluticasone (FLONASE) 50 MCG/ACT nasal spray, Place 2 sprays into both nostrils daily., Disp: , Rfl:  .  guaiFENesin (MUCINEX) 600 MG 12 hr tablet, Take 600 mg by mouth 2 (two) times daily as needed., Disp: , Rfl:  .  metroNIDAZOLE (METROCREAM) 0.75 % cream, Apply 1 application topically 2 (two) times daily. , Disp: , Rfl:  .  Multiple Vitamins-Minerals (CENTRUM SILVER 50+WOMEN PO), Take by mouth daily., Disp: , Rfl:  .  rosuvastatin (CRESTOR) 10 MG tablet, Take 10 mg by mouth daily., Disp: , Rfl:  .  SYNTHROID 100 MCG tablet, 1 tablet. 6 days a week, Disp: , Rfl:  .  valsartan (DIOVAN) 80 MG tablet, TK 1 T PO QD HS, Disp: , Rfl:    Exam: Current vital signs: BP (!) 152/71 (BP Location: Right Arm)   Pulse 100   Temp 98.4 F (36.9 C) (Oral)   Resp 16   Ht 5\' 7"  (1.702 m)   Wt 58.1 kg   SpO2 100%   BMI 20.05 kg/m  Vital signs in last 24 hours: Temp:  [98.4 F (36.9 C)] 98.4 F (36.9 C) (04/27 1617) Pulse Rate:  [100] 100 (04/27 1617) Resp:  [16] 16 (04/27 1617) BP: (152)/(71) 152/71 (04/27 1617) SpO2:  [100 %] 100 % (04/27 1617) Weight:  [58.1 kg] 58.1 kg (04/27 1617)  GENERAL: Awake, alert in NAD lying on ED stretcher.  HEENT: - Normocephalic and atraumatic LUNGS - Normal respiratory effort.  CV - RRR. No LE edema.  ABDOMEN - Soft, nontender Ext: warm, well perfused Psych: very anxious, teary.   NEURO:  Mental Status: Alert and oriented to her name and place. Unable to state her age, day, month, or year. She is able to spell WORLD backwards. Speech/Language: speech is without dysarthria or aphasia. Naming, repetition, fluency, and comprehension intact. Cranial Nerves:  II: PERRL 28mm/brisk. visual fields full.  III, IV, VI: EOMI.  Lid elevation symmetric and full.  V: sensation is intact and symmetrical to face.  VII: Smile is symmetrical.  VIII:hearing intact to voice IX, X: palate elevation is symmetric. Phonation normal.  XI: normal sternocleidomastoid and trapezius muscle strength XLK:GMWNUU is symmetrical without fasciculations.   Motor: 5/5 strength is all muscle groups.  Tone is normal. Bulk is normal.  Sensation- Intact to light touch bilaterally in all  four extremities. Extinction absent to light touch to DSS.  Coordination: FTN intact bilaterally. HKS intact bilaterally. No pronator drift.  DTRs: 2+ throughout.  Gait- deferred  1a Level of Conscious: 0 1b LOC Questions: 1 1c LOC Commands: 0 2 Best Gaze: 0 3 Visual: 0 4 Facial Palsy: 0 5a Motor Arm - left: 0 5b Motor Arm - Right: 0 6a Motor Leg - Left: 0 6b Motor Leg - Right: 0 7 Limb Ataxia: 0 8 Sensory: 0 9 Best Language: 0 10 Dysarthria: 0 11 Extinct. and Inatten.:0 TOTAL: 1  Labs: No results yet.   Imaging  CT head No acute intracranial abnormality seen.   Assessment: 65 yo who presented to ED with her husband who passed out this a.m. Wife called 911 and helped her patient up. Husband was brought by EMS and patient followed in her car. Upon arrival, she was found to be confused in the waiting room and asking questions about how she got here and stating that she could not remember anything. She then agreed to be signed in to the ED.  -On exam, she exhibited no focal neurological finding except for her amnesia. TIA or stroke is low on the differential, but global amnesia requires TIA/stroke workup. Delirium is also likely, but she does not appear to present like a delirious patient. Given that Transient Global Amnesia is associated with a stressful event such as the one this a.m., this is likely her diagnosis. Also, presentation is acute onset without LOC or trauma and she has "broken record" syndrome which further support diagnosis of TGA.  -Given her history of depression, one could consider psychogenic cause, but this is lower on the differential list.  -MHA is less likely as she has no HA.  -However, we will rule out other etiologies for her presentation to r/o stroke, infectious process, or metabolic encephalopathy.  -Have low suspicion for seizure, but consider EEG if she is not better after 24 hours.  - AsTGA can sometimes mimic Wernicke's encephalopathy, will give Thiamine 500mg  IV x one dose.  -TGA should resolve in hours to 24 hours and if last more than 24 hours, other causes must be ruled out.   Impression: TGA with workup to rule out stroke or seizure.   Recommendations: -medicine admit for observation. -MRI brain without contrast. - MRA H&N -ETOH level, UDS, TSH, Vitamin B12, lipid panel, PT/INR, CBC with diff, CMP, UA and culture.  -recommend statin or increased dose of Crestor or change to Atorvastatin if LDL over 70  -Thiamine 500mg  IV x 1.  -Neuro checks q 4 x 24 hours.  -echocardiogram  -consider EEG if suspicion arises or if her symptoms last over 24 hours.   Pt seen by Clance Boll, NP/Neuro and later by MD. I have edited NP's note to reflect my findings and recommendations. Patient with no focal neuro deficits and classic TGA presentation. W/u per above.  Su Monks, MD [facility] [phone]  If 7pm- 7am, please page neurology on call as listed in [place].

## 2020-05-31 NOTE — ED Notes (Signed)
Dr Sabra Heck speaking with pt

## 2020-05-31 NOTE — ED Notes (Signed)
Patient transported to MRI 

## 2020-05-31 NOTE — ED Triage Notes (Signed)
Pt's husband collapsed and she called 68.  He was brought here by ambulance and she followed by car.  When she arrived in the waiting room she was confused and stated she couldn't remember what happened.  Upon questioning waiting room staff, someone said they saw her running down the stairs and she that when she arrived the had grass in her hair.  Pt was also hyperventilating.  Husband stated that the grass may have been in her hair from when she helped him up.  No abnormalities noted on pt's head.    Initially husband did not feel pt should be assessed.  However, pt continues to have repetitive questioning and cannot remember why her husband is here.

## 2020-05-31 NOTE — ED Notes (Signed)
Pt c/o bil temporal headache.  PA notified.  Neurology at bedside.

## 2020-05-31 NOTE — ED Notes (Signed)
Pt ambulated to bathroom w/assistance.

## 2020-05-31 NOTE — ED Provider Notes (Addendum)
[facility] EMERGENCY DEPARTMENT Provider Note   CSN: [id number] Arrival date & time: 05/31/20  1544     History Chief Complaint  Patient presents with  . Altered Mental Status    Tonya Norton is a 65 y.o. female.  HPI   Patient is a 65 year old female with history of hypothyroidism, hypertension and MDD presenting of altered mental status. She states she is unable to remember the past few days of events. Her husband had two episodes of syncope earlier today. Patient was able to call EMS and ascertain help of a neighbor to move her husband. She drove herself the hospital afterward. She is unable to remember these events. Husbands states that he saw pollen and grass in her hair, but he did not witness a fall. Patient is unable to remember a fall. Husband states she was seen by a doctor yesterday morning, but the patient is unable to recall the visit or why she is seeing a Copy. She is able to recall her anniversary, husband's birthday, her previous career as a Geneticist, molecular, that she attended [employer] for [employer] and [employer] for law school. However, she is unable to recall being asked this questions five minutes after being asked. She endorses a mild headache, but denies any changes to vision. She denies anything like this occurring previously.   Some collateral information is provided by patient's husband and friend, Gwenette Greet, who are at bedside.   Level 5 caveat applies due to memory loss.   Past Medical History:  Diagnosis Date  . Chronic fatigue syndrome   . Cystic breast   . DDD (degenerative disc disease)   . Depression   . HSV (herpes simplex virus) infection   . Hypothyroidism   . Migraines    Menstrual  . 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 Mitral valve prolapse   . Nocturia 07/23/2013  . Raynaud's disease   . Rosacea   . Snoring 07/23/2013  . Thyroid nodule     Patient  Active Problem List   Diagnosis Date Noted  . Snoring 07/23/2013  . Nocturia 07/23/2013    Past Surgical History:  Procedure Laterality Date  . benign right breast biopsy    . BREAST EXCISIONAL BIOPSY Right   . DILATION AND CURETTAGE OF UTERUS    . laser to cervix    . SHOULDER SURGERY Right      OB History   No obstetric history on file.     Family History  Problem Relation Age of Onset  . Prostate cancer Father 34       3/15  . Glaucoma Father   . COPD Father   . Lung cancer Father   . Dementia Father   . Melanoma Father   . Emphysema Mother 20       1/19  . Neuropathy Mother   . COPD Mother   . Dementia Mother   . Macular degeneration Mother   . Glaucoma Paternal Grandfather   . Macular degeneration Maternal Grandmother   . Bladder Cancer Maternal Grandfather   . Kidney cancer Maternal Grandfather   . Breast cancer Neg Hx     Social History   Tobacco Use  . Smoking status: Never Smoker  . Smokeless tobacco: Never Used  Vaping Use  . Vaping Use: Never used  Substance Use Topics  . Alcohol use: Yes    Comment: socially 2-3 weekly  . Drug  use: No    Home Medications Prior to Admission medications   Medication Sig Start Date End Date Taking? Authorizing Provider  Alpha-Lipoic Acid 200 MG CAPS Take by mouth daily.    [provider]  Ascorbic Acid (VITAMIN C) 500 MG CAPS Take by mouth daily.    [provider]  cholecalciferol (VITAMIN D3) 25 MCG (1000 UNIT) tablet Take 1,000 Units by mouth daily.    [provider]  Coenzyme Q10 200 MG capsule Take 200 mg by mouth daily.    [provider]  COVID-19 mRNA vaccine, Moderna, 100 MCG/0.5ML injection Inject into the muscle. 05/19/20   Carlyle Basques, MD  estradiol (ESTRACE) 0.1 MG/GM vaginal cream Use twice a week at bedtime.    [provider]  fexofenadine (ALLEGRA) 180 MG tablet Take 180 mg by mouth daily.    [provider]  FLUoxetine (PROZAC) 20 MG  tablet Take 20 mg by mouth daily.  02/02/18   [provider]  fluticasone (FLONASE) 50 MCG/ACT nasal spray Place 2 sprays into both nostrils daily.    [provider]  guaiFENesin (MUCINEX) 600 MG 12 hr tablet Take 600 mg by mouth 2 (two) times daily as needed.    [provider]  metroNIDAZOLE (METROCREAM) 0.75 % cream Apply 1 application topically 2 (two) times daily.  05/04/13   [provider]  Multiple Vitamins-Minerals (CENTRUM SILVER 50+WOMEN PO) Take by mouth daily.    [provider]  rosuvastatin (CRESTOR) 10 MG tablet Take 10 mg by mouth daily. 03/07/20   [provider]  SYNTHROID 100 MCG tablet 1 tablet. 6 days a week 07/07/13   [provider]  valsartan (DIOVAN) 80 MG tablet TK 1 T PO QD HS 03/05/18   [provider]    Allergies    Patient has no known allergies.  Review of Systems   Review of Systems  Unable to perform ROS: Mental status change  Eyes: Negative for photophobia, pain, discharge, itching and visual disturbance.  Musculoskeletal: Negative for gait problem, myalgias, neck pain and neck stiffness.  Neurological: Positive for headaches. Negative for dizziness, tremors, syncope, facial asymmetry, speech difficulty, weakness, light-headedness and numbness.  Psychiatric/Behavioral: Positive for confusion. Negative for agitation, behavioral problems and decreased concentration. The patient is nervous/anxious.     Physical Exam Updated Vital Signs BP (!) 152/71 (BP Location: Right Arm)   Pulse 100   Temp 98.4 F (36.9 C) (Oral)   Resp 16   Ht 5\' 7"  (1.702 m)   Wt 58.1 kg   SpO2 100%   BMI 20.05 kg/m   Physical Exam Vitals and nursing note reviewed. Exam conducted with a chaperone present.  Constitutional:      General: She is not in acute distress.    Appearance: Normal appearance. She is not ill-appearing.  HENT:     Head: Normocephalic and atraumatic.  Eyes:     General: No scleral  icterus.       Right eye: No discharge.        Left eye: No discharge.     Extraocular Movements: Extraocular movements intact.     Pupils: Pupils are equal, round, and reactive to light.  Cardiovascular:     Rate and Rhythm: Normal rate and regular rhythm.     Pulses: Normal pulses.     Heart sounds: Normal heart sounds. No murmur heard. No friction rub. No gallop.   Pulmonary:     Effort: Pulmonary effort is normal. No  respiratory distress.     Breath sounds: Normal breath sounds.  Abdominal:     General: Abdomen is flat. Bowel sounds are normal. There is no distension.     Palpations: Abdomen is soft.     Tenderness: There is no abdominal tenderness.  Musculoskeletal:     Cervical back: Normal range of motion.  Skin:    General: Skin is warm and dry.     Coloration: Skin is not jaundiced.  Neurological:     General: No focal deficit present.     Mental Status: She is alert. Mental status is at baseline. She is disoriented.     Cranial Nerves: Cranial nerves are intact.     Sensory: No sensory deficit.     Motor: Motor function is intact.     Coordination: Coordination is intact. Romberg sign negative. Coordination normal. Finger-Nose-Finger Test and Heel to [facility] Test normal. Rapid alternating movements normal.     Comments: Patient is unable to recall the date, how she arrived at the ED or the time.  Psychiatric:        Mood and Affect: Mood is anxious. Mood is not depressed or elated. Affect is not blunt, angry or inappropriate.        Speech: Speech normal.        Behavior: Behavior normal.        Thought Content: Thought content normal.        Cognition and Memory: Memory is impaired. She exhibits impaired recent memory.     ED Results / Procedures / Treatments   Labs (all labs ordered are listed, but only abnormal results are displayed) Labs Reviewed  RESP PANEL BY RT-PCR (FLU A&B, COVID) ARPGX2  ETHANOL  PROTIME-INR  APTT  CBC  DIFFERENTIAL  COMPREHENSIVE  METABOLIC PANEL  RAPID URINE DRUG SCREEN, HOSP PERFORMED  URINALYSIS, ROUTINE W REFLEX MICROSCOPIC  I-STAT CHEM 8, ED    EKG None  Radiology CT HEAD WO CONTRAST  Result Date: 05/31/2020 CLINICAL DATA:  Altered mental status. EXAM: CT HEAD WITHOUT CONTRAST TECHNIQUE: Contiguous axial images were obtained from the base of the skull through the vertex without intravenous contrast. COMPARISON:  None. FINDINGS: Brain: No evidence of acute infarction, hemorrhage, hydrocephalus, extra-axial collection or mass lesion/mass effect. Vascular: No hyperdense vessel or unexpected calcification. Skull: Normal. Negative for fracture or focal lesion. Sinuses/Orbits: No acute finding. Other: None. IMPRESSION: No acute intracranial abnormality seen. Electronically Signed   By: Marijo Conception M.D.   On: 05/31/2020 16:39    Procedures Procedures   Medications Ordered in ED Medications  acetaminophen (TYLENOL) tablet 650 mg (650 mg Oral Given 06/01/20 0000)    Or  acetaminophen (TYLENOL) 160 MG/5ML solution 650 mg ( Per Tube See Alternative 06/01/20 0000)    Or  acetaminophen (TYLENOL) suppository 650 mg ( Rectal See Alternative 06/01/20 0000)  enoxaparin (LOVENOX) injection 40 mg (40 mg Subcutaneous Given 05/31/20 2343)  rosuvastatin (CRESTOR) tablet 10 mg (10 mg Oral Given 06/01/20 0837)  levothyroxine (SYNTHROID) tablet 100 mcg (100 mcg Oral Given 06/01/20 0644)  irbesartan (AVAPRO) tablet 75 mg (75 mg Oral Given 06/01/20 0838)  fluticasone (FLONASE) 50 MCG/ACT nasal spray 2 spray (2 sprays Each Nare Given 06/01/20 0839)  FLUoxetine (PROZAC) capsule 20 mg (20 mg Oral Given 06/01/20 0837)  thiamine 500mg  in normal saline (59ml) IVPB (0 mg Intravenous Stopped 05/31/20 2000)  acetaminophen (TYLENOL) tablet 1,000 mg (1,000 mg Oral Given 05/31/20 1838)  LORazepam (ATIVAN) injection 1 mg (1 mg Intravenous  Given 05/31/20 2107)   stroke: mapping our early stages of recovery book ( Does not apply Given 05/31/20 2344)   gadobutrol (GADAVIST) 1 MMOL/ML injection 5.5 mL (5.5 mLs Intravenous Contrast Given 05/31/20 2301)  potassium chloride SA (KLOR-CON) CR tablet 40 mEq (40 mEq Oral Given 06/01/20 7858)    ED Course  I have reviewed the triage vital signs and the nursing notes.  Pertinent labs & imaging results that were available during my care of the patient were reviewed by me and considered in my medical decision making (see chart for details).    MDM Rules/Calculators/A&P                          Patient is a 64 year old female presenting with altered mental status. She has medical history of MDD, hypothryoidism, and hypertension that are all well controlled with medication. Neuroexamination normal except for ability to formulate new short term memories. Neurological exam repeated and unchanged. Suspect stress related psychogenic response.CT results are reassuring and show no signs of acute intracranial pathology, making stroke or trauma less likely.  Global transient amnesia is top of differential.   Neurology NP was consulted and decision was made to admit the patient further workup and monitoring. Labs including BMP, CBC do not help differentiate the etiology of AMS. Hemodynamically stable. Doubt sepsis, hemorrhage or cardiac etiology. No signs of trauma.   Discussed HPI, physical exam and plan of care for this patient with attending Noemi Chapel. The attending physician evaluated this patient as part of a shared visit and agrees with plan of care.  Final Clinical Impression(s) / ED Diagnoses Final diagnoses:  Altered mental status, unspecified altered mental status type  TGA (transient global amnesia)   Sherrill Raring, PA-C 05/31/20 1959    Noemi Chapel, MD 05/31/20 2018    Sherrill Raring, PA-C 06/01/20 1507    Noemi Chapel, MD 06/10/20 1438

## 2020-05-31 NOTE — ED Provider Notes (Signed)
Medical screening examination/treatment/procedure(s) were conducted as a shared visit with non-physician practitioner(s) and myself.  I personally evaluated the patient during the encounter.  Clinical Impression:   Final diagnoses:  Altered mental status, unspecified altered mental status type  TGA (transient global amnesia)    Patient is a very pleasant 65 year old female, she is a retired Acupuncturist, she was with her husband walking in the community when he went unresponsive, she had to call 911 and go for help, she is here accompanying him today and states that she cannot remember any of that, she does not know what happened, she does not know what is going on or what is even wrong with her husband,  On exam the patient is in mild anxious distress but has normal cardiac pulmonary and neurologic exams.  The only neurologic finding that is abnormal is that she cannot form new memories, even through the course of the discussion she cannot remember questions that I ask her.  Neurologic exam:  Speech clear, pupils equal round reactive to light, extraocular movements intact Normal peripheral visual fields Cranial nerves III through XII normal including no facial droop Follows commands, moves all extremities x4, normal strength to bilateral upper and lower extremities at all major muscle groups including grip Sensation normal to light touch and pinprick Coordination intact, no limb ataxia, finger-nose-finger normal, heel shin normal bilaterally Rapid alternating movements normal No pronator drift Gait normal Can heal and toe walk without weakness.  Discussed with neuro service who has seen patient and wants her admitted for the rest of the stroke w/u.  CT negative.  CT ordered - will d/w neuro - seems to be related to stressful event - TGA.  I discussed the care with Dr. Alcario Drought who is happy to come see the patient for admission     Noemi Chapel, MD 05/31/20 2017

## 2020-05-31 NOTE — H&P (Signed)
History and Physical    LISIA Norton UXL:[id number] DOB: August 06, 1955 DOA: 05/31/2020  PCP: Crist Infante, MD  Patient coming from: Home  I have personally briefly reviewed patient's old medical records in [place]  Chief Complaint: Memory loss  HPI: Tonya Norton is a 65 y.o. female with medical history significant of HTN, HLD controlled on crestor.  Pt's husband had syncope this AM, pt called 911, followed ambulance in her car.  In ED pt noted to be confused by staff and visitors, kept asking what had happened.  (Husband had been taken emergently to cath lab for PPM placement).  Pt with no history of similar symptoms.  No h/o stroke, TBI, seizures.  No neurologic deficits today other than the amnesia.  Cannot recall events of today.  Symptoms persistent, constant, nothing makes better or worse.  Pt's husband had 3rd degree AV block and thankfully is doing well on 3E post PPM placement.   ED Course: CT head neg.   Review of Systems: As per HPI, otherwise all review of systems negative.  Past Medical History:  Diagnosis Date  . Chronic fatigue syndrome   . Cystic breast   . DDD (degenerative disc disease)   . Depression   . HSV (herpes simplex virus) infection   . Hypothyroidism   . Migraines    Menstrual  . 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 Mitral valve prolapse   . Nocturia 07/23/2013  . Raynaud's disease   . Rosacea   . Snoring 07/23/2013  . Thyroid nodule     Past Surgical History:  Procedure Laterality Date  . benign right breast biopsy    . BREAST EXCISIONAL BIOPSY Right   . DILATION AND CURETTAGE OF UTERUS    . laser to cervix    . SHOULDER SURGERY Right      reports that she has never smoked. She has never used smokeless tobacco. She reports current alcohol use. She reports that she does not use drugs.  No Known Allergies  Family History  Problem Relation Age of Onset  . Prostate cancer Father  28       3/15  . Glaucoma Father   . COPD Father   . Lung cancer Father   . Dementia Father   . Melanoma Father   . Emphysema Mother 61       1/19  . Neuropathy Mother   . COPD Mother   . Dementia Mother   . Macular degeneration Mother   . Glaucoma Paternal Grandfather   . Macular degeneration Maternal Grandmother   . Bladder Cancer Maternal Grandfather   . Kidney cancer Maternal Grandfather   . Breast cancer Neg Hx      Prior to Admission medications   Medication Sig Start Date End Date Taking? Authorizing Provider  Alpha-Lipoic Acid 200 MG CAPS Take by mouth daily.    [provider]  Ascorbic Acid (VITAMIN C) 500 MG CAPS Take by mouth daily.    [provider]  cholecalciferol (VITAMIN D3) 25 MCG (1000 UNIT) tablet Take 1,000 Units by mouth daily.    [provider]  Coenzyme Q10 200 MG capsule Take 200 mg by mouth daily.    [provider]  COVID-19 mRNA vaccine, Moderna, 100 MCG/0.5ML injection Inject into the muscle. 05/19/20   Carlyle Basques, MD  estradiol (ESTRACE) 0.1 MG/GM vaginal cream Use twice a week at bedtime.    [provider]  fexofenadine (ALLEGRA) 180 MG tablet Take 180 mg by mouth daily.    [provider]  FLUoxetine (PROZAC) 20 MG tablet Take 20 mg by mouth daily.  02/02/18   [provider]  fluticasone (FLONASE) 50 MCG/ACT nasal spray Place 2 sprays into both nostrils daily.    [provider]  guaiFENesin (MUCINEX) 600 MG 12 hr tablet Take 600 mg by mouth 2 (two) times daily as needed.    [provider]  metroNIDAZOLE (METROCREAM) 0.75 % cream Apply 1 application topically 2 (two) times daily.  05/04/13   [provider]  Multiple Vitamins-Minerals (CENTRUM SILVER 50+WOMEN PO) Take by mouth daily.    [provider]  rosuvastatin (CRESTOR) 10 MG tablet Take 10 mg by mouth daily. 03/07/20   [provider]  SYNTHROID 100 MCG tablet 1 tablet. 6 days a  week 07/07/13   [provider]  valsartan (DIOVAN) 80 MG tablet TK 1 T PO QD HS 03/05/18   [provider]    Physical Exam: Vitals:   05/31/20 1900 05/31/20 1915 05/31/20 1930 05/31/20 1945  BP: (!) 148/73 (!) 147/72 (!) 153/73 (!) 151/73  Pulse: 80 81 78 85  Resp: 17 (!) 25 14 19   Temp:      TempSrc:      SpO2: 100% 100% 100% 100%  Weight:      Height:        Constitutional: NAD, calm, comfortable Eyes: PERRL, lids and conjunctivae normal ENMT: Mucous membranes are moist. Posterior pharynx clear of any exudate or lesions.Normal dentition.  Neck: normal, supple, no masses, no thyromegaly Respiratory: clear to auscultation bilaterally, no wheezing, no crackles. Normal respiratory effort. No accessory muscle use.  Cardiovascular: Regular rate and rhythm, no murmurs / rubs / gallops. No extremity edema. 2+ pedal pulses. No carotid bruits.  Abdomen: no tenderness, no masses palpated. No hepatosplenomegaly. Bowel sounds positive.  Musculoskeletal: no clubbing / cyanosis. No joint deformity upper and lower extremities. Good ROM, no contractures. Normal muscle tone.  Skin: no rashes, lesions, ulcers. No induration Neurologic: CN 2-12 grossly intact. Sensation intact, DTR normal. Strength 5/5 in all 4.  Psychiatric: Normal judgment and insight. Alert and oriented x 3. Normal mood.    Labs on Admission: I have personally reviewed following labs and imaging studies  CBC: Recent Labs  Lab 05/31/20 1829 05/31/20 1843  WBC 10.2  --   NEUTROABS 8.2*  --   HGB 13.6 13.6  HCT 40.9 40.0  MCV 93.4  --   PLT 243  --    Basic Metabolic Panel: Recent Labs  Lab 05/31/20 1829 05/31/20 1843  NA 135 136  K 3.5 3.5  CL 102 105  CO2 20*  --   GLUCOSE 95 89  BUN 15 18  CREATININE 0.93 0.80  CALCIUM 9.4  --    GFR: Estimated Creatinine Clearance: 64.3 mL/min (by C-G formula based on SCr of 0.8 mg/dL). Liver Function Tests: Recent Labs  Lab 05/31/20 1829  AST 31   ALT 22  ALKPHOS 73  BILITOT 0.8  PROT 7.6  ALBUMIN 4.2   No results for input(s): LIPASE, AMYLASE in the last 168 hours. No results for input(s): AMMONIA in the last 168 hours. Coagulation Profile: Recent Labs  Lab 05/31/20 1829  INR 1.0   Cardiac Enzymes: No results for input(s): CKTOTAL, CKMB, CKMBINDEX, TROPONINI in the last 168 hours. BNP (last 3 results) No results for input(s): PROBNP in the last 8760 hours. HbA1C: No results  for input(s): HGBA1C in the last 72 hours. CBG: No results for input(s): GLUCAP in the last 168 hours. Lipid Profile: No results for input(s): CHOL, HDL, LDLCALC, TRIG, CHOLHDL, LDLDIRECT in the last 72 hours. Thyroid Function Tests: Recent Labs    05/31/20 1829  TSH 0.766   Anemia Panel: No results for input(s): VITAMINB12, FOLATE, FERRITIN, TIBC, IRON, RETICCTPCT in the last 72 hours. Urine analysis:    Component Value Date/Time   COLORURINE YELLOW 05/31/2020 1829   APPEARANCEUR HAZY (A) 05/31/2020 1829   LABSPEC 1.015 05/31/2020 1829   PHURINE 6.0 05/31/2020 1829   GLUCOSEU NEGATIVE 05/31/2020 [place] NEGATIVE 05/31/2020 1829   BILIRUBINUR NEGATIVE 05/31/2020 1829   KETONESUR 20 (A) 05/31/2020 1829   PROTEINUR NEGATIVE 05/31/2020 1829   NITRITE NEGATIVE 05/31/2020 1829   LEUKOCYTESUR NEGATIVE 05/31/2020 1829    Radiological Exams on Admission: CT HEAD WO CONTRAST  Result Date: 05/31/2020 CLINICAL DATA:  Altered mental status. EXAM: CT HEAD WITHOUT CONTRAST TECHNIQUE: Contiguous axial images were obtained from the base of the skull through the vertex without intravenous contrast. COMPARISON:  None. FINDINGS: Brain: No evidence of acute infarction, hemorrhage, hydrocephalus, extra-axial collection or mass lesion/mass effect. Vascular: No hyperdense vessel or unexpected calcification. Skull: Normal. Negative for fracture or focal lesion. Sinuses/Orbits: No acute finding. Other: None. IMPRESSION: No acute intracranial abnormality  seen. Electronically Signed   By: Marijo Conception M.D.   On: 05/31/2020 16:39    EKG: Independently reviewed.  Assessment/Plan Principal Problem:   TGA (transient global amnesia) Active Problems:   HTN (hypertension)   HLD (hyperlipidemia)    1. TGA - 1. Admit for obs 2. Stroke pathway 3. MRI/MRA head and neck 4. Thiamine 5. 2d echo 6. PT/OT/SLP 7. EEG if symptoms persist > 24h 2. HTN - 1. Cont home BP meds when med rec complete 3. HLD - 1. Cont statin 2. Check FLP in AM  DVT prophylaxis: Lovenox Code Status: Full Family Communication: Daughter at bedside Disposition Plan: Home after obs admit and symptoms resolved Consults called: Neuro Admission status: Place in 70   Ani Deoliveira, [facility]  How to contact the [facility] Attending or Consulting provider [place] or covering provider during after hours [place], for this patient?  1. Check the care team in [facility] and look for a) attending/consulting TR[facility] provider listed and b) the [facility] team listed 2. Log into [email or web address]  Amion Physician Scheduling and messaging for groups and whole hospitals  On call and physician scheduling software for group practices, residents, hospitalists and other medical providers for call, clinic, rotation and shift schedules. On[employer] is a hospital-wide system for scheduling doctors and paging doctors on call. EasyPlot is for scientific plotting and data analysis.  [email or web address]  and use [employer]'s universal password to access. If you do not have the password, please contact the hospital operator.  3. Locate the [facility] provider you are looking for under [facility] and page to a number that you can be directly reached. 4. If you still have difficulty reaching the provider, please page the [facility] (Director on Call) for the Hospitalists listed on amion for assistance.  05/31/2020, 8:33 PM

## 2020-06-01 ENCOUNTER — Ambulatory Visit (HOSPITAL_COMMUNITY): Payer: Medicare Other

## 2020-06-01 ENCOUNTER — Other Ambulatory Visit: Payer: Self-pay

## 2020-06-01 ENCOUNTER — Observation Stay (HOSPITAL_COMMUNITY): Payer: Medicare Other

## 2020-06-01 DIAGNOSIS — G454 Transient global amnesia: Secondary | ICD-10-CM | POA: Diagnosis not present

## 2020-06-01 DIAGNOSIS — I10 Essential (primary) hypertension: Secondary | ICD-10-CM | POA: Diagnosis not present

## 2020-06-01 DIAGNOSIS — R4182 Altered mental status, unspecified: Secondary | ICD-10-CM

## 2020-06-01 LAB — COMPREHENSIVE METABOLIC PANEL
ALT: 20 U/L (ref 0–44)
AST: 30 U/L (ref 15–41)
Albumin: 3.8 g/dL (ref 3.5–5.0)
Alkaline Phosphatase: 61 U/L (ref 38–126)
Anion gap: 7 (ref 5–15)
BUN: 11 mg/dL (ref 8–23)
CO2: 23 mmol/L (ref 22–32)
Calcium: 9.3 mg/dL (ref 8.9–10.3)
Chloride: 106 mmol/L (ref 98–111)
Creatinine, Ser: 0.78 mg/dL (ref 0.44–1.00)
GFR, Estimated: >60 mL/min (ref >60)
Glucose, Bld: 110 mg/dL — ABNORMAL HIGH (ref 70–99)
Potassium: 4 mmol/L (ref 3.5–5.1)
Sodium: 136 mmol/L (ref 135–145)
Total Bilirubin: 0.6 mg/dL (ref 0.3–1.2)
Total Protein: 6.9 g/dL (ref 6.5–8.1)

## 2020-06-01 LAB — LIPID PANEL
Cholesterol: 155 mg/dL (ref 0–200)
HDL: 63 mg/dL (ref >40)
LDL Cholesterol: 81 mg/dL (ref 0–99)
Total CHOL/HDL Ratio: 2.5 RATIO
Triglycerides: 54 mg/dL (ref <150)
VLDL: 11 mg/dL (ref 0–40)

## 2020-06-01 LAB — HIV ANTIBODY (ROUTINE TESTING W REFLEX): HIV Screen 4th Generation wRfx: NONREACTIVE

## 2020-06-01 LAB — HEMOGLOBIN A1C
Hgb A1c MFr Bld: 5.6 % (ref 4.8–5.6)
Mean Plasma Glucose: 114.02 mg/dL

## 2020-06-01 LAB — ETHANOL: Alcohol, Ethyl (B): <10 mg/dL (ref <10)

## 2020-06-01 LAB — MAGNESIUM: Magnesium: 2.2 mg/dL (ref 1.7–2.4)

## 2020-06-01 MED ORDER — CLOPIDOGREL BISULFATE 75 MG PO TABS: 75.0000 mg | ORAL_TABLET | Freq: Every day | ORAL | 0 refills | Status: DC

## 2020-06-01 MED ORDER — POTASSIUM CHLORIDE CRYS ER 20 MEQ PO TBCR
40.0000 meq | EXTENDED_RELEASE_TABLET | Freq: Once | ORAL | Status: AC
  Administered 2020-06-01: 40 meq via ORAL
  Filled 2020-06-01: qty 2

## 2020-06-01 NOTE — Procedures (Signed)
Patient Name: Tonya Norton  MRN: [id number]  Epilepsy Attending: Lora Havens  Referring Physician/Provider: Anibal Henderson, NP Date: 06/01/2020 Duration: 26.11 mins  Patient history: 30 old female with transient alteration of awareness.  EEG done for seizures.  Level of alertness: Awake  AEDs during EEG study: None  Technical aspects: This EEG study was done with scalp electrodes positioned according to the 10-20 International system of electrode placement. Electrical activity was acquired at a sampling rate of 500Hz  and reviewed with a high frequency filter of 70Hz  and a low frequency filter of 1Hz . EEG data were recorded continuously and digitally stored.   Description: The posterior dominant rhythm consists of 9 Hz activity of moderate voltage (25-35 uV) seen predominantly in posterior head regions, symmetric and reactive to eye opening and eye closing. Hyperventilation and photic stimulation were not performed.     IMPRESSION: This study is within normal limits. No seizures or epileptiform discharges were seen throughout the recording.  Finnis Colee Barbra Sarks

## 2020-06-01 NOTE — Plan of Care (Signed)
  Problem: Education: Goal: Knowledge of disease or condition will improve 06/01/2020 1449 by Drucie Ip I, RN Outcome: Adequate for Discharge 06/01/2020 1102 by Drucie Ip I, RN Outcome: Progressing Goal: Knowledge of secondary prevention will improve 06/01/2020 1449 by Drucie Ip I, RN Outcome: Adequate for Discharge 06/01/2020 1102 by Drucie Ip I, RN Outcome: Progressing Goal: Knowledge of patient specific risk factors addressed and post discharge goals established will improve 06/01/2020 1449 by Drucie Ip I, RN Outcome: Adequate for Discharge 06/01/2020 1102 by Drucie Ip I, RN Outcome: Progressing   Problem: Coping: Goal: Will verbalize positive feelings about self 06/01/2020 1449 by Drucie Ip I, RN Outcome: Adequate for Discharge 06/01/2020 1102 by Drucie Ip I, RN Outcome: Progressing Goal: Will identify appropriate support needs 06/01/2020 1449 by Drucie Ip I, RN Outcome: Adequate for Discharge 06/01/2020 1102 by Drucie Ip I, RN Outcome: Progressing

## 2020-06-01 NOTE — Plan of Care (Signed)
  Problem: Education: Goal: Knowledge of disease or condition will improve Outcome: Progressing Goal: Knowledge of secondary prevention will improve Outcome: Progressing Goal: Knowledge of patient specific risk factors addressed and post discharge goals established will improve Outcome: Progressing   Problem: Coping: Goal: Will verbalize positive feelings about self Outcome: Progressing Goal: Will identify appropriate support needs Outcome: Progressing   

## 2020-06-01 NOTE — Progress Notes (Signed)
EEG complete - results pending. EEG complete - results pending.  

## 2020-06-01 NOTE — Discharge Summary (Signed)
Tonya Norton XAJ:[id number] DOB: 03-Feb-1956 DOA: 05/31/2020  PCP: Crist Infante, MD  Admit date: 05/31/2020  Discharge date: 06/01/2020  Admitted From: Home   Disposition:  Home   Recommendations for Outpatient Follow-up:   Follow up with PCP in 1-2 weeks  PCP Please obtain BMP/CBC, 2 view CXR in 1week,  (see Discharge instructions)   PCP Please follow up on the following pending results: check B12, RPR, needs a TTE and Neuro follow up   Home Health: None Equipment/Devices: None  Consultations: Neuro Discharge Condition: Stable    CODE STATUS: Full    Diet Recommendation: Heart Healthy   Diet Order            Diet - low sodium heart healthy           Diet Heart Room service appropriate? Yes; Fluid consistency: Thin  Diet effective now                  Chief Complaint  Patient presents with  . Altered Mental Status     Brief history of present illness from the day of admission and additional interim summary    Tonya Norton is a 65 y.o. female with medical history significant of HTN, HLD controlled on crestor. Pt's husband had syncope this AM, pt called 911, followed ambulance in her car. In ED pt noted to be confused by staff and visitors, kept asking what had happened.  (Husband had been taken emergently to cath lab for PPM placement).  Pt with no history of similar symptoms.  No h/o stroke, TBI, seizures.  No neurologic deficits today other than the amnesia.                                                                 Hospital Course    1.  Episode of amnesia which is rapidly improving and almost back to baseline.  No headache or focal deficits, MRI/MRA head and neck unremarkable, EEG stable, could have been transient global amnesia versus a small TIA which I highly doubt, LDL is near goal  A1c is stable, stable TSH, work-up is complete and unremarkable except echocardiogram which patient prefers to get done outpatient.  Since TIA is not completely ruled out I will place her on 1 month of Plavix (she has allergy to aspirin and would not like to try it) with outpatient echocardiogram, PCP and neurology follow-up.  Note she is close to her baseline now.  We will have her follow with PCP and neurology outpatient post discharge.  2.  Hypertension, dyslipidemia.  Continue home medications unchanged.  Discharge diagnosis     Principal Problem:   TGA (transient global amnesia) Active Problems:   HTN (hypertension)   HLD (hyperlipidemia)    Discharge instructions    Discharge Instructions  Diet - low sodium heart healthy   Complete by: As directed    Discharge instructions   Complete by: As directed    Follow with Primary MD Crist Infante, MD in 7 days   Get CBC, CMP  -  checked next visit within 1 week by Primary MD    Activity: As tolerated with Full fall precautions use walker/cane & assistance as needed  Disposition Home    Diet: Heart Healthy    Special Instructions: If you have smoked or chewed Tobacco  in the last 2 yrs please stop smoking, stop any regular Alcohol  and or any Recreational drug use.  On your next visit with your primary care physician please Get Medicines reviewed and adjusted.  Please request your Prim.MD to go over all Hospital Tests and Procedure/Radiological results at the follow up, please get all Hospital records sent to your Prim MD by signing hospital release before you go home.  If you experience worsening of your admission symptoms, develop shortness of breath, life threatening emergency, suicidal or homicidal thoughts you must seek medical attention immediately by calling 911 or calling your MD immediately  if symptoms less severe.  You Must read complete instructions/literature along with all the possible adverse reactions/side  effects for all the Medicines you take and that have been prescribed to you. Take any new Medicines after you have completely understood and accpet all the possible adverse reactions/side effects.   Increase activity slowly   Complete by: As directed       Discharge Medications   Allergies as of 06/01/2020      Reactions   Aspirin    Other reaction(s): may have caused some cough or wheeze but not totally sure in 2012 so could rechallenge if needed.      Medication List    TAKE these medications   Alpha-Lipoic Acid 200 MG Caps Take 200 mg by mouth daily.   CENTRUM SILVER 50+WOMEN PO Take 1 tablet by mouth daily.   cholecalciferol 25 MCG (1000 UNIT) tablet Commonly known as: VITAMIN D3 Take 1,000 Units by mouth daily.   clopidogrel 75 MG tablet Commonly known as: Plavix Take 1 tablet (75 mg total) by mouth daily.   Coenzyme Q10 200 MG capsule Take 200 mg by mouth daily.   FLUoxetine 20 MG tablet Commonly known as: PROZAC Take 20 mg by mouth daily.   fluticasone 50 MCG/ACT nasal spray Commonly known as: FLONASE Place 2 sprays into both nostrils daily.   guaiFENesin 600 MG 12 hr tablet Commonly known as: MUCINEX Take 600 mg by mouth 2 (two) times daily as needed for cough.   rosuvastatin 10 MG tablet Commonly known as: CRESTOR Take 10 mg by mouth daily.   Synthroid 100 MCG tablet Generic drug: levothyroxine Take 100 mcg by mouth See admin instructions. 6 days a week Monday-saturday   valsartan 80 MG tablet Commonly known as: DIOVAN Take 80 mg by mouth daily.   Vitamin C 500 MG Caps Take 500 mg by mouth daily.        Follow-up Information    Crist Infante, MD. Schedule an appointment as soon as possible for a visit in 1 week(s).   Specialty: Internal Medicine Contact information: [address] [place] [address] [place]. Schedule an appointment as soon as possible for a visit in 1 week(s).   Why:  TGA Contact information: [address]  [place] [phone]       Chilton Siandolph, Tiffany, MD. Schedule an appointment as soon as possible for a visit in 1 week(s).   Specialty: Cardiology Why: TTE Contact information: [address] [place] [address] [phone]               Major procedures and Radiology Reports - PLEASE review detailed and final reports thoroughly  -       DG Chest 2 View  Result Date: 05/31/2020 CLINICAL DATA:  Confusion and loss of memory. EXAM: CHEST - 2 VIEW COMPARISON:  None. FINDINGS: Mild hyperinflation. Heart size and pulmonary vascularity are normal. Lungs are clear. No pleural effusions. No pneumothorax. Mediastinal contours appear intact. IMPRESSION: No active cardiopulmonary disease. Electronically Signed   By: Burman NievesWilliam  Stevens M.D.   On: 05/31/2020 20:35   DG Abdomen 1 View  Result Date: 05/31/2020 CLINICAL DATA:  Confusion and loss of memory. EXAM: ABDOMEN - 1 VIEW COMPARISON:  None. FINDINGS: Scattered gas and stool throughout the colon. No small or large bowel distention. No radiopaque stones. Calcifications in the pelvis are likely phleboliths. Possible fibroid calcification. Degenerative changes in the spine and hips. IMPRESSION: Nonobstructive bowel gas pattern. Electronically Signed   By: Burman NievesWilliam  Stevens M.D.   On: 05/31/2020 20:35   CT HEAD WO CONTRAST  Result Date: 05/31/2020 CLINICAL DATA:  Altered mental status. EXAM: CT HEAD WITHOUT CONTRAST TECHNIQUE: Contiguous axial images were obtained from the base of the skull through the vertex without intravenous contrast. COMPARISON:  None. FINDINGS: Brain: No evidence of acute infarction, hemorrhage, hydrocephalus, extra-axial collection or mass lesion/mass effect. Vascular: No hyperdense vessel or unexpected calcification. Skull: Normal. Negative for fracture or focal lesion. Sinuses/Orbits: No acute finding. Other: None. IMPRESSION:  No acute intracranial abnormality seen. Electronically Signed   By: Lupita RaiderJames  Green Jr M.D.   On: 05/31/2020 16:39   MR ANGIO HEAD WO CONTRAST  Result Date: 05/31/2020 CLINICAL DATA:  Initial evaluation for acute memory loss. Unresponsiveness. EXAM: MRI HEAD WITHOUT CONTRAST MRA HEAD WITHOUT CONTRAST MRA NECK WITHOUT AND WITH CONTRAST TECHNIQUE: Multiplanar, multi-echo pulse sequences of the brain and surrounding structures were acquired without intravenous contrast. Angiographic images of the Circle of Willis were acquired using MRA technique without intravenous contrast. Angiographic images of the neck were acquired using MRA technique without and with intravenous contrast. Carotid stenosis measurements (when applicable) are obtained utilizing NASCET criteria, using the distal internal carotid diameter as the denominator. CONTRAST:  5.5 cc of Gadavist. COMPARISON:  Prior head CT from earlier the same day. FINDINGS: MRI HEAD FINDINGS Brain: Cerebral volume within normal limits for patient age. No focal parenchymal signal abnormality identified. No abnormal foci of restricted diffusion to suggest acute or subacute ischemia. Gray-white matter differentiation well maintained. No encephalomalacia to suggest chronic infarction. No foci of susceptibility artifact to suggest acute or chronic intracranial hemorrhage. No mass lesion, midline shift or mass effect. No hydrocephalus. No extra-axial fluid collection. Major dural sinuses are grossly patent. Pituitary gland and suprasellar region are normal. Midline structures intact and normal. Vascular: Major intracranial vascular flow voids well maintained and normal in appearance. Skull and upper cervical spine: Craniocervical junction normal. Visualized upper cervical spine within normal limits. Bone marrow signal intensity normal. No scalp soft tissue abnormality. Sinuses/Orbits: Globes and orbital soft tissues within normal limits. Paranasal sinuses are clear. No mastoid  effusion. Inner ear structures normal. Other: None. MRA HEAD FINDINGS ANTERIOR CIRCULATION: Visualized distal cervical segments of the internal carotid arteries are  widely patent with antegrade flow. Petrous, cavernous, and supraclinoid segments widely patent without stenosis or other abnormality. Left A1 segment widely patent. Right A1 segment hypoplastic and/or absent, accounting for the diminutive right ICA is compared to the left. Normal anterior communicating artery complex. Anterior cerebral arteries patent to their distal aspects without stenosis. No M1 stenosis or occlusion. Normal MCA bifurcations. Distal MCA branches well perfused and symmetric. POSTERIOR CIRCULATION: Strongly dominant left vertebral artery, with hypoplastic right vertebral artery. Both visualized V4 segments widely patent. Left PICA origin patent and normal. Right PICA of also appears to be patent, seen only on corresponding MRA neck portion of this exam. Basilar patent to its distal aspect without stenosis. Superior cerebellar arteries patent bilaterally. Both PCAs primarily supplied via the basilar. PCAs well perfused to their distal aspects without stenosis. Right P2 segment branches early. No intracranial aneurysm or other vascular abnormality. MRA NECK FINDINGS AORTIC ARCH: Visualized aortic arch of normal caliber. Bovine branching pattern with common origin of the innominate and left common carotid artery noted. No hemodynamically significant stenosis seen about the origin of the great vessels. RIGHT CAROTID SYSTEM: Right common and internal carotid arteries widely patent without stenosis, evidence for dissection or occlusion. No significant atheromatous narrowing or irregularity about the right carotid bifurcation. LEFT CAROTID SYSTEM: Left common and internal carotid arteries widely patent without stenosis, evidence for dissection or occlusion. No significant atheromatous narrowing or irregularity about the left carotid  bifurcation. VERTEBRAL ARTERIES: Both vertebral arteries arise from the subclavian arteries. No proximal subclavian artery stenosis. Left vertebral artery dominant. Incidental note made of a moderate to severe distal left subclavian artery stenosis (series 1047, image 10), well beyond the takeoff of the left vertebral artery. Vertebral arteries widely patent within the neck without stenosis, evidence for dissection or occlusion. IMPRESSION: MRI HEAD IMPRESSION: Normal brain MRI.  No acute intracranial abnormality identified. MRA HEAD IMPRESSION: Normal intracranial MRA. No large vessel occlusion, hemodynamically significant stenosis, or other acute vascular abnormality. No aneurysm. MRA NECK IMPRESSION: 1. Normal MRA of the neck with wide patency of both carotid artery systems and vertebral arteries. 2. Incidental short-segment moderate to severe distal left subclavian artery stenosis as above. This is well beyond the takeoff of the left vertebral artery. Electronically Signed   By: Jeannine Boga M.D.   On: 05/31/2020 23:23   MR ANGIO NECK W WO CONTRAST  Result Date: 05/31/2020 CLINICAL DATA:  Initial evaluation for acute memory loss. Unresponsiveness. EXAM: MRI HEAD WITHOUT CONTRAST MRA HEAD WITHOUT CONTRAST MRA NECK WITHOUT AND WITH CONTRAST TECHNIQUE: Multiplanar, multi-echo pulse sequences of the brain and surrounding structures were acquired without intravenous contrast. Angiographic images of the Circle of Willis were acquired using MRA technique without intravenous contrast. Angiographic images of the neck were acquired using MRA technique without and with intravenous contrast. Carotid stenosis measurements (when applicable) are obtained utilizing NASCET criteria, using the distal internal carotid diameter as the denominator. CONTRAST:  5.5 cc of Gadavist. COMPARISON:  Prior head CT from earlier the same day. FINDINGS: MRI HEAD FINDINGS Brain: Cerebral volume within normal limits for patient age.  No focal parenchymal signal abnormality identified. No abnormal foci of restricted diffusion to suggest acute or subacute ischemia. Gray-white matter differentiation well maintained. No encephalomalacia to suggest chronic infarction. No foci of susceptibility artifact to suggest acute or chronic intracranial hemorrhage. No mass lesion, midline shift or mass effect. No hydrocephalus. No extra-axial fluid collection. Major dural sinuses are grossly patent. Pituitary gland and suprasellar region are normal. Midline  structures intact and normal. Vascular: Major intracranial vascular flow voids well maintained and normal in appearance. Skull and upper cervical spine: Craniocervical junction normal. Visualized upper cervical spine within normal limits. Bone marrow signal intensity normal. No scalp soft tissue abnormality. Sinuses/Orbits: Globes and orbital soft tissues within normal limits. Paranasal sinuses are clear. No mastoid effusion. Inner ear structures normal. Other: None. MRA HEAD FINDINGS ANTERIOR CIRCULATION: Visualized distal cervical segments of the internal carotid arteries are widely patent with antegrade flow. Petrous, cavernous, and supraclinoid segments widely patent without stenosis or other abnormality. Left A1 segment widely patent. Right A1 segment hypoplastic and/or absent, accounting for the diminutive right ICA is compared to the left. Normal anterior communicating artery complex. Anterior cerebral arteries patent to their distal aspects without stenosis. No M1 stenosis or occlusion. Normal MCA bifurcations. Distal MCA branches well perfused and symmetric. POSTERIOR CIRCULATION: Strongly dominant left vertebral artery, with hypoplastic right vertebral artery. Both visualized V4 segments widely patent. Left PICA origin patent and normal. Right PICA of also appears to be patent, seen only on corresponding MRA neck portion of this exam. Basilar patent to its distal aspect without stenosis. Superior  cerebellar arteries patent bilaterally. Both PCAs primarily supplied via the basilar. PCAs well perfused to their distal aspects without stenosis. Right P2 segment branches early. No intracranial aneurysm or other vascular abnormality. MRA NECK FINDINGS AORTIC ARCH: Visualized aortic arch of normal caliber. Bovine branching pattern with common origin of the innominate and left common carotid artery noted. No hemodynamically significant stenosis seen about the origin of the great vessels. RIGHT CAROTID SYSTEM: Right common and internal carotid arteries widely patent without stenosis, evidence for dissection or occlusion. No significant atheromatous narrowing or irregularity about the right carotid bifurcation. LEFT CAROTID SYSTEM: Left common and internal carotid arteries widely patent without stenosis, evidence for dissection or occlusion. No significant atheromatous narrowing or irregularity about the left carotid bifurcation. VERTEBRAL ARTERIES: Both vertebral arteries arise from the subclavian arteries. No proximal subclavian artery stenosis. Left vertebral artery dominant. Incidental note made of a moderate to severe distal left subclavian artery stenosis (series 1047, image 10), well beyond the takeoff of the left vertebral artery. Vertebral arteries widely patent within the neck without stenosis, evidence for dissection or occlusion. IMPRESSION: MRI HEAD IMPRESSION: Normal brain MRI.  No acute intracranial abnormality identified. MRA HEAD IMPRESSION: Normal intracranial MRA. No large vessel occlusion, hemodynamically significant stenosis, or other acute vascular abnormality. No aneurysm. MRA NECK IMPRESSION: 1. Normal MRA of the neck with wide patency of both carotid artery systems and vertebral arteries. 2. Incidental short-segment moderate to severe distal left subclavian artery stenosis as above. This is well beyond the takeoff of the left vertebral artery. Electronically Signed   By: Jeannine Boga  M.D.   On: 05/31/2020 23:23   MR BRAIN WO CONTRAST  Result Date: 05/31/2020 CLINICAL DATA:  Initial evaluation for acute memory loss. Unresponsiveness. EXAM: MRI HEAD WITHOUT CONTRAST MRA HEAD WITHOUT CONTRAST MRA NECK WITHOUT AND WITH CONTRAST TECHNIQUE: Multiplanar, multi-echo pulse sequences of the brain and surrounding structures were acquired without intravenous contrast. Angiographic images of the Circle of Willis were acquired using MRA technique without intravenous contrast. Angiographic images of the neck were acquired using MRA technique without and with intravenous contrast. Carotid stenosis measurements (when applicable) are obtained utilizing NASCET criteria, using the distal internal carotid diameter as the denominator. CONTRAST:  5.5 cc of Gadavist. COMPARISON:  Prior head CT from earlier the same day. FINDINGS: MRI HEAD FINDINGS Brain: Cerebral  volume within normal limits for patient age. No focal parenchymal signal abnormality identified. No abnormal foci of restricted diffusion to suggest acute or subacute ischemia. Gray-white matter differentiation well maintained. No encephalomalacia to suggest chronic infarction. No foci of susceptibility artifact to suggest acute or chronic intracranial hemorrhage. No mass lesion, midline shift or mass effect. No hydrocephalus. No extra-axial fluid collection. Major dural sinuses are grossly patent. Pituitary gland and suprasellar region are normal. Midline structures intact and normal. Vascular: Major intracranial vascular flow voids well maintained and normal in appearance. Skull and upper cervical spine: Craniocervical junction normal. Visualized upper cervical spine within normal limits. Bone marrow signal intensity normal. No scalp soft tissue abnormality. Sinuses/Orbits: Globes and orbital soft tissues within normal limits. Paranasal sinuses are clear. No mastoid effusion. Inner ear structures normal. Other: None. MRA HEAD FINDINGS ANTERIOR  CIRCULATION: Visualized distal cervical segments of the internal carotid arteries are widely patent with antegrade flow. Petrous, cavernous, and supraclinoid segments widely patent without stenosis or other abnormality. Left A1 segment widely patent. Right A1 segment hypoplastic and/or absent, accounting for the diminutive right ICA is compared to the left. Normal anterior communicating artery complex. Anterior cerebral arteries patent to their distal aspects without stenosis. No M1 stenosis or occlusion. Normal MCA bifurcations. Distal MCA branches well perfused and symmetric. POSTERIOR CIRCULATION: Strongly dominant left vertebral artery, with hypoplastic right vertebral artery. Both visualized V4 segments widely patent. Left PICA origin patent and normal. Right PICA of also appears to be patent, seen only on corresponding MRA neck portion of this exam. Basilar patent to its distal aspect without stenosis. Superior cerebellar arteries patent bilaterally. Both PCAs primarily supplied via the basilar. PCAs well perfused to their distal aspects without stenosis. Right P2 segment branches early. No intracranial aneurysm or other vascular abnormality. MRA NECK FINDINGS AORTIC ARCH: Visualized aortic arch of normal caliber. Bovine branching pattern with common origin of the innominate and left common carotid artery noted. No hemodynamically significant stenosis seen about the origin of the great vessels. RIGHT CAROTID SYSTEM: Right common and internal carotid arteries widely patent without stenosis, evidence for dissection or occlusion. No significant atheromatous narrowing or irregularity about the right carotid bifurcation. LEFT CAROTID SYSTEM: Left common and internal carotid arteries widely patent without stenosis, evidence for dissection or occlusion. No significant atheromatous narrowing or irregularity about the left carotid bifurcation. VERTEBRAL ARTERIES: Both vertebral arteries arise from the subclavian  arteries. No proximal subclavian artery stenosis. Left vertebral artery dominant. Incidental note made of a moderate to severe distal left subclavian artery stenosis (series 1047, image 10), well beyond the takeoff of the left vertebral artery. Vertebral arteries widely patent within the neck without stenosis, evidence for dissection or occlusion. IMPRESSION: MRI HEAD IMPRESSION: Normal brain MRI.  No acute intracranial abnormality identified. MRA HEAD IMPRESSION: Normal intracranial MRA. No large vessel occlusion, hemodynamically significant stenosis, or other acute vascular abnormality. No aneurysm. MRA NECK IMPRESSION: 1. Normal MRA of the neck with wide patency of both carotid artery systems and vertebral arteries. 2. Incidental short-segment moderate to severe distal left subclavian artery stenosis as above. This is well beyond the takeoff of the left vertebral artery. Electronically Signed   By: Jeannine Boga M.D.   On: 05/31/2020 23:23   EEG adult  Result Date: 06/01/2020 Lora Havens, MD     06/01/2020 12:14 PM Patient Name: Tonya Norton MRN: [id number] Epilepsy Attending: Lora Havens Referring Physician/Provider: Anibal Henderson, NP Date: 06/01/2020 Duration: 26.11 mins Patient history: 51 old female  with transient alteration of awareness.  EEG done for seizures. Level of alertness: Awake AEDs during EEG study: None Technical aspects: This EEG study was done with scalp electrodes positioned according to the 10-20 International system of electrode placement. Electrical activity was acquired at a sampling rate of 500Hz  and reviewed with a high frequency filter of 70Hz  and a low frequency filter of 1Hz . EEG data were recorded continuously and digitally stored. Description: The posterior dominant rhythm consists of 9 Hz activity of moderate voltage (25-35 uV) seen predominantly in posterior head regions, symmetric and reactive to eye opening and eye closing. Hyperventilation and photic  stimulation were not performed.   IMPRESSION: This study is within normal limits. No seizures or epileptiform discharges were seen throughout the recording. Tonya Norton    Micro Results     Recent Results (from the past 240 hour(s))  Resp Panel by RT-PCR (Flu A&B, Covid) Nasopharyngeal Swab     Status: None   Collection Time: 05/31/20  6:29 PM   Specimen: Nasopharyngeal Swab; Nasopharyngeal(NP) swabs in vial transport medium  Result Value Ref Range Status   SARS Coronavirus 2 by RT PCR NEGATIVE NEGATIVE Final    Comment: (NOTE) SARS-CoV-2 target nucleic acids are NOT DETECTED.  The SARS-CoV-2 RNA is generally detectable in upper respiratory specimens during the acute phase of infection. The lowest concentration of SARS-CoV-2 viral copies this assay can detect is 138 copies/mL. A negative result does not preclude SARS-Cov-2 infection and should not be used as the sole basis for treatment or other patient management decisions. A negative result may occur with  improper specimen collection/handling, submission of specimen other than nasopharyngeal swab, presence of viral mutation(s) within the areas targeted by this assay, and inadequate number of viral copies(<138 copies/mL). A negative result must be combined with clinical observations, patient history, and epidemiological information. The expected result is Negative.  Fact Sheet for Patients:  EntrepreneurPulse.com.au  Fact Sheet for Healthcare Providers:  IncredibleEmployment.be  This test is no t yet approved or cleared by the [place] FDA and  has been authorized for detection and/or diagnosis of SARS-CoV-2 by FDA under an Emergency Use Authorization (EUA). This EUA will remain  in effect (meaning this test can be used) for the duration of the COVID-19 declaration under Section 564(b)(1) of the Act, 21 U.S.C.section 360bbb-3(b)(1), unless the authorization is terminated  or  revoked sooner.       Influenza A by PCR NEGATIVE NEGATIVE Final   Influenza B by PCR NEGATIVE NEGATIVE Final    Comment: (NOTE) The Xpert Xpress SARS-CoV-2/FLU/RSV plus assay is intended as an aid in the diagnosis of influenza from Nasopharyngeal swab specimens and should not be used as a sole basis for treatment. Nasal washings and aspirates are unacceptable for Xpert Xpress SARS-CoV-2/FLU/RSV testing.  Fact Sheet for Patients: EntrepreneurPulse.com.au  Fact Sheet for Healthcare Providers: IncredibleEmployment.be  This test is not yet approved or cleared by the [place] FDA and has been authorized for detection and/or diagnosis of SARS-CoV-2 by FDA under an Emergency Use Authorization (EUA). This EUA will remain in effect (meaning this test can be used) for the duration of the COVID-19 declaration under Section 564(b)(1) of the Act, 21 U.S.C. section 360bbb-3(b)(1), unless the authorization is terminated or revoked.  Performed at [facility], [place] [address] [place], [place] [address]     Today   Subjective    Shamonique Rogalla today has no headache,no chest abdominal pain,no new weakness tingling or numbness, feels  much better wants to go home today.     Objective   Blood pressure 106/63, pulse 71, temperature 98.5 F (36.9 C), temperature source Oral, resp. rate 16, height 5\' 7"  (1.702 m), weight 58.1 kg, SpO2 100 %.   Intake/Output Summary (Last 24 hours) at 06/01/2020 1419 Last data filed at 05/31/2020 2000 Gross per 24 hour  Intake 50 ml  Output --  Net 50 ml    Exam  Awake Alert, No new F.N deficits, Normal affect Sentinel.AT,PERRAL Supple Neck,No JVD, No cervical lymphadenopathy appriciated.  Symmetrical Chest wall movement, Good air movement bilaterally, CTAB RRR,No Gallops,Rubs or new Murmurs, No Parasternal Heave +ve B.Sounds, Abd Soft, Non tender, No organomegaly appriciated, No rebound -guarding or  rigidity. No Cyanosis, Clubbing or edema, No new Rash or bruise   Data Review   CBC w Diff:  Lab Results  Component Value Date   WBC 10.2 05/31/2020   HGB 13.6 05/31/2020   HCT 40.0 05/31/2020   PLT 243 05/31/2020   LYMPHOPCT 13 05/31/2020   MONOPCT 6 05/31/2020   EOSPCT 0 05/31/2020   BASOPCT 0 05/31/2020    CMP:  Lab Results  Component Value Date   NA 136 06/01/2020   K 4.0 06/01/2020   CL 106 06/01/2020   CO2 23 06/01/2020   BUN 11 06/01/2020   CREATININE 0.78 06/01/2020   PROT 6.9 06/01/2020   ALBUMIN 3.8 06/01/2020   BILITOT 0.6 06/01/2020   ALKPHOS 61 06/01/2020   AST 30 06/01/2020   ALT 20 06/01/2020  .  Lab Results  Component Value Date   HGBA1C 5.6 06/01/2020   Lab Results  Component Value Date   CHOL 155 06/01/2020   HDL 63 06/01/2020   LDLCALC 81 06/01/2020   TRIG 54 06/01/2020   CHOLHDL 2.5 06/01/2020   Lab Results  Component Value Date   TSH 0.766 05/31/2020    Total Time in preparing paper work, data evaluation and todays exam - 35 minutes  Lala Lund M.D on 06/01/2020 at 2:19 PM  [facility]

## 2020-06-01 NOTE — Progress Notes (Signed)
Neurology Progress Note  Brief HPI: 65 year old female who presented to the ER after her husband experienced a syncopal episode at home. Found to be confused/amnestic in the waiting area without other focal neurologic deficits and was admitted for further evaluation.  Subjective: No acute overnight events Patient endorses remaining amnestic to yesterday's events but has regained memory of small details overnight She is able to recall events from the morning such as ordering breakfast but complains of trouble remembering chronological events such as today's date and the month.    Exam: Vitals:   06/01/20 0318 06/01/20 0418  BP: 111/60 112/77  Pulse: 69 66  Resp: 18 18  Temp: 98.4 F (36.9 C) 98.1 F (36.7 C)  SpO2: 100% 98%   Gen: Sitting up in bed, in no acute distress Psych: Appears slightly anxious and tearful Resp: non-labored breathing, normal respirations Abd: soft, non-tender, non-distended  Neuro: Mental Status: Patient is awake, alert, and oriented to self, year, place, and situation (as told to her by family, friends, and staff but states that she only recalls driving to the hospital and being in the MRI scanner). She is able to state that the month is April but claims she only recalls the month due to the information board in her room. She states that she recalls the Easter holiday and watching her chapel service on the television at home with her husband.  Her speech is intact without dysarthria or aphasia.  No neglect is noted.  CN: Pupils are equal, round, reactive to light, 4 mm / brisk, visual fields are full, EOMI with 2-3 beats of nystagmus in lateral gaze bilaterally, facial sensation to light touch is intact and symmetric, face is symmetric resting and smiling, hearing is intact to voice, shoulder shrug is symmetric, tongue protrudes midline without fasciculations, phonation normal, palate rises symmetrically. Motor: 5/5 strength throughout each extremity without  vertical drift.  Sensory: Sensation intact and symmetric to light touch in bilateral upper and lower extremities.  DTR: 3+ and symmetric patellae, 2+ and symmetric biceps Plantar: Downgoing bilaterally Coordination: FNF and HKS intact bilaterally Gait: Deferred  Pertinent Labs: CBC    Component Value Date/Time   WBC 10.2 05/31/2020 1829   RBC 4.38 05/31/2020 1829   HGB 13.6 05/31/2020 1843   HCT 40.0 05/31/2020 1843   PLT 243 05/31/2020 1829   MCV 93.4 05/31/2020 1829   MCH 31.1 05/31/2020 1829   MCHC 33.3 05/31/2020 1829   RDW 12.8 05/31/2020 1829   LYMPHSABS 1.4 05/31/2020 1829   MONOABS 0.6 05/31/2020 1829   EOSABS 0.0 05/31/2020 1829   BASOSABS 0.0 05/31/2020 1829   CMP     Component Value Date/Time   NA 136 05/31/2020 1843   K 3.5 05/31/2020 1843   CL 105 05/31/2020 1843   CO2 20 (L) 05/31/2020 1829   GLUCOSE 89 05/31/2020 1843   BUN 18 05/31/2020 1843   CREATININE 0.80 05/31/2020 1843   CALCIUM 9.4 05/31/2020 1829   PROT 7.6 05/31/2020 1829   ALBUMIN 4.2 05/31/2020 1829   AST 31 05/31/2020 1829   ALT 22 05/31/2020 1829   ALKPHOS 73 05/31/2020 1829   BILITOT 0.8 05/31/2020 1829   GFRNONAA >60 05/31/2020 1829   Urinalysis    Component Value Date/Time   COLORURINE YELLOW 05/31/2020 1829   APPEARANCEUR HAZY (A) 05/31/2020 1829   LABSPEC 1.015 05/31/2020 1829   PHURINE 6.0 05/31/2020 1829   GLUCOSEU NEGATIVE 05/31/2020 1829   HGBUR NEGATIVE 05/31/2020 [place] NEGATIVE 05/31/2020  [place] 20 (A) 05/31/2020 1829   PROTEINUR NEGATIVE 05/31/2020 1829   NITRITE NEGATIVE 05/31/2020 1829   LEUKOCYTESUR NEGATIVE 05/31/2020 1829   Lab Results  Component Value Date   CHOL 155 06/01/2020   HDL 63 06/01/2020   LDLCALC 81 06/01/2020   TRIG 54 06/01/2020   CHOLHDL 2.5 06/01/2020   Lab Results  Component Value Date   HGBA1C 5.6 06/01/2020   Imaging Personally Reviewed: MRI HEAD IMPRESSION: Normal brain MRI.  No acute intracranial  abnormality identified.  MRA HEAD IMPRESSION: Normal intracranial MRA. No large vessel occlusion, hemodynamically significant stenosis, or other acute vascular abnormality. No aneurysm.  MRA NECK IMPRESSION: 1. Normal MRA of the neck with wide patency of both carotid artery systems and vertebral arteries. 2. Incidental short-segment moderate to severe distal left subclavian artery stenosis as above. This is well beyond the takeoff of the left vertebral artery.  CT Head without contrast: No acute intracranial abnormality seen  EEG 4/28: "IMPRESSION: This study is within normal limits. No seizures or epileptiform discharges were seen throughout the recording."  Assessment: 65 y.o. who presented to the ER 05/31/20 with her husband who had a syncopal episode and was found to be confused in the waiting area warranting TGA evaluation with stroke work up. - Examination reveals patient with improving amnesia; some events from yesterday with clarity but with persistent chronological confusion.  - MRI / MRA / CT without acute intracranial abnormality; further stroke work up unremarkable. LDL slightly elevated at 81. Echocardiogram pending.  - Presentation most consistent with transient global amnesia / functional presentation. Less likely DDx includes migraine headache, delirium, seizure, stroke/TIA. Patient without history of MHA, seizure, or stroke. Imaging without evidence of stroke and presentation not consistent with delirium without infectious or metabolic abnormalities and acute onset during stressful event- TGA more likely. Routine EEG without evidence of seizures or epileptiform discharges. - AsTGA can sometimes mimic Wernicke's encephalopathy patient was given Thiamine 500mg  IV x one dose.  -TGA should resolve in hours to 24 hours and if last more than 24 hours, other causes must be ruled out.   Impression:  Transient Global Amnesia   Recommendations: - Echocardiogram pending; further  stroke work up unremarkable - LDL 81, recommend Atorvastatin 40 mg with lipid panel follow up outpatient - No further neurology recommendations at this time  Anibal Henderson, AGACNP-BC [facility] [phone]

## 2020-06-01 NOTE — Evaluation (Signed)
Speech Language Pathology Evaluation Patient Details Name: Tonya Norton MRN: [id number] DOB: 1955/04/20 Today's Date: 06/01/2020 Time: 9371-6967 SLP Time Calculation (min) (ACUTE ONLY): 40 min  Problem List:  Patient Active Problem List   Diagnosis Date Noted  . 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  . HTN (hypertension) 05/31/2020  . HLD (hyperlipidemia) 05/31/2020  . Snoring 07/23/2013  . Nocturia 07/23/2013   Past Medical History:  Past Medical History:  Diagnosis Date  . Chronic fatigue syndrome   . Cystic breast   . DDD (degenerative disc disease)   . Depression   . HSV (herpes simplex virus) infection   . Hypothyroidism   . Migraines    Menstrual  . 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 Mitral valve prolapse   . Nocturia 07/23/2013  . Raynaud's disease   . Rosacea   . Snoring 07/23/2013  . Thyroid nodule    Past Surgical History:  Past Surgical History:  Procedure Laterality Date  . benign right breast biopsy    . BREAST EXCISIONAL BIOPSY Right   . DILATION AND CURETTAGE OF UTERUS    . laser to cervix    . SHOULDER SURGERY Right    HPI:  Pt is a 65 y/o female admitted 05/31/20 with STM/amnesia/cognitive changes after she brought her husband to the hospital. MRI and CT negative PMHx includes HTN, HLD.   Assessment / Plan / Recommendation Clinical Impression  The [place] Mental Status (SLUMS) Examination was administered. Pt scored 22/30, indicating mild neurocognitive deficits. Points were lost on attention/mental math, thought organization, delayed recall (recalled 2/5 words), auditory attention and recall. Pt and husband both report significant improvement since yesterday. At this time, recommend 24 hour supervision for the next week, assistance with med management and other independent skills. Pt was encouraged to write things down to establish a routine, and assist with functional recall if amnesia  persists. Pt was encouraged to notify PCP if memory and attention deficits do not improve. A referral to outpatient speech therapy may be beneficial at that time.    SLP Assessment  SLP Recommendation/Assessment: All further Speech Language Pathology needs can be addressed in the next venue of care  SLP Visit Diagnosis: Attention and concentration deficit    Follow Up Recommendations  Home health SLP;Outpatient SLP (if needs arise, or if cognitive deficits do not resolve.)       SLP Evaluation Cognition  Overall Cognitive Status: Impaired/Different from baseline Arousal/Alertness: Awake/alert Orientation Level: Oriented X4 Memory: Impaired Memory Impairment: Retrieval deficit;Decreased short term memory;Decreased recall of new information Decreased Short Term Memory: Verbal basic       Comprehension  Auditory Comprehension Overall Auditory Comprehension: Appears within functional limits for tasks assessed    Expression Expression Primary Mode of Expression: Verbal Verbal Expression Overall Verbal Expression: Appears within functional limits for tasks assessed   Oral / Motor  Oral Motor/Sensory Function Overall Oral Motor/Sensory Function: Within functional limits Motor Speech Overall Motor Speech: Appears within functional limits for tasks assessed   GO                   Alver Leete B. Quentin Ore, [facility], [place] Speech Language Pathologist Office: [phone]  Shonna Chock 06/01/2020, 3:34 PM

## 2020-06-01 NOTE — Discharge Instructions (Signed)
Follow with Primary MD Crist Infante, MD in 7 days   Get CBC, CMP  -  checked next visit within 1 week by Primary MD    Activity: As tolerated with Full fall precautions use walker/cane & assistance as needed  Disposition Home    Diet: Heart Healthy    Special Instructions: If you have smoked or chewed Tobacco  in the last 2 yrs please stop smoking, stop any regular Alcohol  and or any Recreational drug use.  On your next visit with your primary care physician please Get Medicines reviewed and adjusted.  Please request your Prim.MD to go over all Hospital Tests and Procedure/Radiological results at the follow up, please get all Hospital records sent to your Prim MD by signing hospital release before you go home.  If you experience worsening of your admission symptoms, develop shortness of breath, life threatening emergency, suicidal or homicidal thoughts you must seek medical attention immediately by calling 911 or calling your MD immediately  if symptoms less severe.  You Must read complete instructions/literature along with all the possible adverse reactions/side effects for all the Medicines you take and that have been prescribed to you. Take any new Medicines after you have completely understood and accpet all the possible adverse reactions/side effects.

## 2020-06-01 NOTE — Evaluation (Signed)
Occupational Therapy Evaluation and Discharge Patient Details Name: Tonya Norton MRN: [id number] DOB: 01-Sep-1955 Today's Date: 06/01/2020    History of Present Illness Pt is a 65 y/o female admitted 05/31/20 with STM/amnesia/cognitive changes after she brought her husband to the hospital. MRI and CT negative PMHx includes HTN, HLD.   Clinical Impression   Pt is typically independent in ADL and mobility. She is active and enjoys walks, retired Forensic psychologist. Today Pt is mod I for all aspects of ADL. She can dress herself, open containers for breakfast, complete transfers and mobility without assist. She reports she feels a little unsteady (no LOB noticed) and that she doesn't feel like her memory has returned yet. Educated Pt in STM compensatory strategies, and Pt was able to "problem solve" a safety situation. OT will dc from services at this time.      Follow Up Recommendations  No OT follow up;Supervision - Intermittent    Equipment Recommendations  None recommended by OT    Recommendations for Other Services       Precautions / Restrictions Restrictions Weight Bearing Restrictions: No      Mobility Bed Mobility               General bed mobility comments: OOB at beginning and end of session    Transfers Overall transfer level: Independent                    Balance Overall balance assessment: No apparent balance deficits (not formally assessed)                                         ADL either performed or assessed with clinical judgement   ADL Overall ADL's : Modified independent                                       General ADL Comments: LB dressing (including tying shoes), sink level grooming, transfers     Vision Baseline Vision/History: Wears glasses Wears Glasses: At all times (contacts) Patient Visual Report: No change from baseline Vision Assessment?: No apparent visual deficits Additional Comments: able to  navigate unfamiliar environment without problem, find objects     Perception     Praxis      Pertinent Vitals/Pain Pain Assessment: No/denies pain     Hand Dominance Right   Extremity/Trunk Assessment Upper Extremity Assessment Upper Extremity Assessment: Overall WFL for tasks assessed   Lower Extremity Assessment Lower Extremity Assessment: Overall WFL for tasks assessed   Cervical / Trunk Assessment Cervical / Trunk Assessment: Normal   Communication Communication Communication: No difficulties   Cognition Arousal/Alertness: Awake/alert Behavior During Therapy: WFL for tasks assessed/performed Overall Cognitive Status: Impaired/Different from baseline Area of Impairment: Memory                     Memory: Decreased short-term memory         General Comments: Pt continues to have gaps in memory from her husbands medical event, and the events the occured once she came to the hospital. She reports that she still feels like her memory is "off" She is able to problem solve safety situations and Pt educated on STM compensatory strategies.   General Comments  OT/PT did walk Pt down from 3W to 3E to  see her husband, RN notified, telemetry notified. OT then walked Pt back to 3W and was left in room to finish breakfast. RN heading to rounding with plans to visit Pt afterwards for update    Exercises     Shoulder Instructions      Home Living Family/patient expects to be discharged to:: Private residence Living Arrangements: Spouse/significant other Available Help at Discharge: Family;Friend(s);Available 24 hours/day Type of Home: House Home Access: Stairs to enter [employer] of Steps: 2 Entrance Stairs-Rails: Right Home Layout: Two level;1/2 bath on main level;Bed/bath upstairs Alternate Level Stairs-Number of Steps: flight   Bathroom Shower/Tub: Occupational psychologist: Handicapped height Bathroom Accessibility: Yes How Accessible:  Accessible via walker Home Equipment: Shower seat - built in;Grab bars - toilet;Grab bars - tub/shower;Hand held shower head   Additional Comments: Husband in hospital (new pacemaker 4/27) and so limited in physical assistance      Prior Functioning/Environment Level of Independence: Independent        Comments: retired Materials engineer, enjoys being active        OT Problem List: Decreased cognition      OT Treatment/Interventions:      OT Goals(Current goals can be found in the care plan section) Acute Rehab OT Goals Patient Stated Goal: "To figure out what happened" OT Goal Formulation: With patient Time For Goal Achievement: 06/15/20 Potential to Achieve Goals: Good  OT Frequency:     Barriers to D/C:            Co-evaluation              AM-PAC OT "6 Clicks" Daily Activity     Outcome Measure Help from another person eating meals?: None Help from another person taking care of personal grooming?: None Help from another person toileting, which includes using toliet, bedpan, or urinal?: None Help from another person bathing (including washing, rinsing, drying)?: None Help from another person to put on and taking off regular upper body clothing?: None Help from another person to put on and taking off regular lower body clothing?: None 6 Click Score: 24   End of Session Nurse Communication: Mobility status  Activity Tolerance: Patient tolerated treatment well Patient left: in chair;with call bell/phone within reach  OT Visit Diagnosis: Other symptoms and signs involving cognitive function                Time:  923- 946  23 min Charges:  OT General Charges $OT Visit: 1 Visit OT Evaluation $OT Eval Low Complexity: [place] OTR/L Acute Rehabilitation Services Pager: [phone] Office: [place] 06/01/2020, 9:47 AM

## 2020-06-01 NOTE — TOC Transition Note (Signed)
Transition of Care [facility]) - CM/SW Discharge Note   Patient Details  Name: Tonya Norton MRN: [id number] Date of Birth: [birth year]  [facility] CM/SW Contact:  Pollie Friar, RN Phone Number: 06/01/2020, 2:38 PM   Clinical Narrative:    Patient is discharging home with self care. No needs per TOC.   Final next level of care: Home/Self Care Barriers to Discharge: No Barriers Identified   Patient Goals and CMS Choice        Discharge Placement                       Discharge Plan and Services                                     Social Determinants of Health (SDOH) Interventions     Readmission Risk Interventions No flowsheet data found.

## 2020-06-01 NOTE — Evaluation (Signed)
Physical Therapy Evaluation Patient Details Name: Tonya Norton MRN: [id number] DOB: 28-Sep-1955 Today's Date: 06/01/2020   History of Present Illness  Pt is a 65 y/o female admitted 05/31/20 with STM/amnesia/cognitive changes after she brought her husband to the hospital. MRI and CT negative PMHx includes HTN, HLD.  Clinical Impression  PTA, patient independent with mobility and enjoys being active. Patient presents with unsteadiness per patient but no apparent balance deficits noted. Patient continues to have difficulty with memory and recalling events leading up to hospital. Patient functioning at independent level for mobility. No further skilled PT needs required during hospital stay. No PT follow up recommended at this time.     Follow Up Recommendations No PT follow up    Equipment Recommendations  None recommended by PT    Recommendations for Other Services       Precautions / Restrictions Precautions Precautions: None Restrictions Weight Bearing Restrictions: No      Mobility  Bed Mobility Overal bed mobility: Independent             General bed mobility comments: OOB at beginning and end of session    Transfers Overall transfer level: Independent                  Ambulation/Gait Ambulation/Gait assistance: Independent Gait Distance (Feet): 1000 Feet Assistive device: None Gait Pattern/deviations: WFL(Within Functional Limits)        Stairs Stairs: Yes Stairs assistance: Independent Stair Management: No rails;Alternating pattern;Forwards Number of Stairs: 4    Wheelchair Mobility    Modified Rankin (Stroke Patients Only)       Balance Overall balance assessment: No apparent balance deficits (not formally assessed)                                           Pertinent Vitals/Pain Pain Assessment: No/denies pain    Home Living Family/patient expects to be discharged to:: Private residence Living Arrangements:  Spouse/significant other Available Help at Discharge: Family;Friend(s);Available 24 hours/day Type of Home: House Home Access: Stairs to enter Entrance Stairs-Rails: Right Entrance Stairs-Number of Steps: 2 Home Layout: Two level;1/2 bath on main level;Bed/bath upstairs Home Equipment: Shower seat - built in;Grab bars - toilet;Grab bars - tub/shower;Hand held shower head Additional Comments: Husband in hospital (new pacemaker 4/27) and so limited in physical assistance    Prior Function Level of Independence: Independent         Comments: retired Materials engineer, enjoys being active     Journalist, newspaper   Dominant Hand: Right    Extremity/Trunk Assessment   Upper Extremity Assessment Upper Extremity Assessment: Overall WFL for tasks assessed    Lower Extremity Assessment Lower Extremity Assessment: Overall WFL for tasks assessed    Cervical / Trunk Assessment Cervical / Trunk Assessment: Normal  Communication   Communication: No difficulties  Cognition Arousal/Alertness: Awake/alert Behavior During Therapy: WFL for tasks assessed/performed Overall Cognitive Status: Impaired/Different from baseline Area of Impairment: Memory                     Memory: Decreased short-term memory         General Comments: Pt continues to have gaps in memory from her husbands medical event, and the events the occured once she came to the hospital. She reports that she still feels like her memory is "off" She is able to problem solve safety situations and  Pt educated on STM compensatory strategies.      General Comments General comments (skin integrity, edema, etc.): OT/PT did walk Pt down from 3W to 3E to see her husband, RN notified, telemetry notified. OT then walked Pt back to 3W and was left in room to finish breakfast. RN heading to rounding with plans to visit Pt afterwards for update    Exercises     Assessment/Plan    PT Assessment Patent does not need any further PT  services  PT Problem List         PT Treatment Interventions      PT Goals (Current goals can be found in the Care Plan section)  Acute Rehab PT Goals Patient Stated Goal: "To figure out what happened" PT Goal Formulation: With patient    Frequency     Barriers to discharge        Co-evaluation               AM-PAC PT "6 Clicks" Mobility  Outcome Measure Help needed turning from your back to your side while in a flat bed without using bedrails?: None Help needed moving from lying on your back to sitting on the side of a flat bed without using bedrails?: None Help needed moving to and from a bed to a chair (including a wheelchair)?: None Help needed standing up from a chair using your arms (e.g., wheelchair or bedside chair)?: None Help needed to walk in hospital room?: None Help needed climbing 3-5 steps with a railing? : None 6 Click Score: 24    End of Session   Activity Tolerance: Patient tolerated treatment well Patient left: Other (comment) (In husband's room on 3E with OT) Nurse Communication: Mobility status PT Visit Diagnosis: Unsteadiness on feet (R26.81)    Time: 1610-9604 PT Time Calculation (min) (ACUTE ONLY): 46 min   Charges:   PT Evaluation $PT Eval Low Complexity: 1 Low          Arby Dahir A. Gilford Rile PT, DPT Acute Rehabilitation Services Pager [phone] Office [phone]   Linna Hoff 06/01/2020, 10:21 AM

## 2020-06-05 ENCOUNTER — Ambulatory Visit: Payer: 59 | Admitting: Cardiology

## 2020-06-08 ENCOUNTER — Encounter: Payer: Self-pay | Admitting: Cardiovascular Disease

## 2020-06-08 ENCOUNTER — Ambulatory Visit (INDEPENDENT_AMBULATORY_CARE_PROVIDER_SITE_OTHER): Payer: Medicare Other | Admitting: Cardiovascular Disease

## 2020-06-08 ENCOUNTER — Other Ambulatory Visit: Payer: Self-pay

## 2020-06-08 VITALS — BP 142/72 | HR 78 | Ht 67.0 in | Wt 128.0 lb

## 2020-06-08 DIAGNOSIS — I340 Nonrheumatic mitral (valve) insufficiency: Secondary | ICD-10-CM

## 2020-06-08 DIAGNOSIS — I341 Nonrheumatic mitral (valve) prolapse: Secondary | ICD-10-CM

## 2020-06-08 DIAGNOSIS — E785 Hyperlipidemia, unspecified: Secondary | ICD-10-CM | POA: Diagnosis not present

## 2020-06-08 DIAGNOSIS — I10 Essential (primary) hypertension: Secondary | ICD-10-CM | POA: Diagnosis not present

## 2020-06-08 DIAGNOSIS — G454 Transient global amnesia: Secondary | ICD-10-CM

## 2020-06-08 NOTE — Patient Instructions (Addendum)
Medication Instructions:  Your physician recommends that you continue on your current medications as directed. Please refer to the Current Medication list given to you today.   *If you need a refill on your cardiac medications before your next appointment, please call your pharmacy*  Lab Work: FASTING LP/CMET IN 6 MONTHS PRIOR TO FOLLOW UP   If you have labs (blood work) drawn today and your tests are completely normal, you will receive your results only by: Marland Kitchen MyChart Message (if you have MyChart) OR . A paper copy in the mail If you have any lab test that is abnormal or we need to change your treatment, we will call you to review the results.  Testing/Procedures: Your physician has requested that you have an echocardiogram. Echocardiography is a painless test that uses sound waves to create images of your heart. It provides your doctor with information about the size and shape of your heart and how well your heart's chambers and valves are working. This procedure takes approximately one hour. There are no restrictions for this procedure. [address]   Follow-Up: At [facility], you and your health needs are our priority.  As part of our continuing mission to provide you with exceptional heart care, we have created designated Provider Care Teams.  These Care Teams include your primary Cardiologist (physician) and Advanced Practice Providers (APPs -  Physician Assistants and Nurse Practitioners) who all work together to provide you with the care you need, when you need it.  We recommend signing up for the patient portal called "MyChart".  Sign up information is provided on this After Visit Summary.  MyChart is used to connect with patients for Virtual Visits (Telemedicine).  Patients are able to view lab/test results, encounter notes, upcoming appointments, etc.  Non-urgent messages can be sent to your provider as well.   To learn more about what you can do with  MyChart, go to [email or web address].    Your next appointment:   6 month(s)  The format for your next appointment:   In Person  Provider:   DR [facility]

## 2020-06-08 NOTE — Progress Notes (Signed)
Cardiology Office Note   Date:  06/08/2020   ID:  SHANITHA TWINING, DOB 1955-05-31, MRN [id number]  PCP:  Tonya Infante, MD  Cardiologist:   Tonya Latch, MD   No chief complaint on file.    History of Present Illness: Tonya Norton is a 65 y.o. 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  At the request of Tonya Infante, MD.  She was seen in the hospital earlier this month.  Her husband had a syncopal episode that morning.  She called 911 and followed the ambulance in her car.  Upon arrival she was confused and disoriented.  Her husband ultimately needed a pacemaker implanted.  She was evaluated and had no neurologic deficits on imaging.  She had an echo 04/2019 that revealed LVEF 60 to 65% with grade 2 diastolic dysfunction.  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  The echo was otherwise unremarkable.  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  They could not completely rule out TIA so they recommended one month of clopidogrel.  She filled the prescription but decided not to take it.  Of note, her MR-A incidentally noted a short segment of moderate to severe left distal subclavian artery stenosis.  She has not noted any left arm numbness, tingling, pain, or weakness.  She has been checking her blood pressures in both arms and the left arm is usually about 6-8 points lower than the right.  Tonya Norton arrange for her to see a vascular specialist.  Overall Tonya Norton has felt well.  She has not experienced any recurrent symptoms.  Her diet is healthy.  The only things she struggles with are cheese, chocolate and ice cream.  She exercises at lest 30 minutes most days per week and has no exertional symptoms.  She denies lower extremity edema, orthopnea, or PND.   Past Medical History:  Diagnosis Date  . Chronic  fatigue syndrome   . Cystic breast   . DDD (degenerative disc disease)   . Depression   . HSV (herpes simplex virus) infection   . Hypothyroidism   . Migraines    Menstrual  . 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 Mitral valve prolapse   . Nocturia 07/23/2013  . Raynaud's disease   . Rosacea   . Snoring 07/23/2013  . Thyroid nodule     Past Surgical History:  Procedure Laterality Date  . benign right breast biopsy    . BREAST EXCISIONAL BIOPSY Right   . DILATION AND CURETTAGE OF UTERUS    . laser to cervix    . SHOULDER SURGERY Right      Current Outpatient Medications  Medication Sig Dispense Refill  . Alpha-Lipoic Acid 200 MG CAPS Take 200 mg by mouth daily.    . Ascorbic Acid (VITAMIN C) 500 MG CAPS Take 500 mg by mouth daily.    . cholecalciferol (VITAMIN D3) 25 MCG (1000 UNIT) tablet Take 1,000 Units by mouth daily.    . Coenzyme Q10 200 MG capsule Take 200 mg by mouth daily.    Marland Kitchen FLUoxetine (PROZAC) 20 MG tablet Take 20 mg by mouth daily.     . fluticasone (FLONASE) 50 MCG/ACT nasal spray Place 2 sprays into both nostrils daily.    Marland Kitchen guaiFENesin (MUCINEX) 600 MG 12 hr tablet  Take 600 mg by mouth 2 (two) times daily as needed for cough.    . Multiple Vitamins-Minerals (CENTRUM SILVER 50+WOMEN PO) Take 1 tablet by mouth daily.    . rosuvastatin (CRESTOR) 10 MG tablet Take 10 mg by mouth daily.    Marland Kitchen SYNTHROID 100 MCG tablet Take 100 mcg by mouth See admin instructions. 6 days a week Monday-saturday    . valsartan (DIOVAN) 80 MG tablet Take 80 mg by mouth daily.     No current facility-administered medications for this visit.    Allergies:   Aspirin    Social History:  The patient  reports that she has never smoked. She has never used smokeless tobacco. She reports current alcohol use. She reports that she does not use drugs.   Family History:  The patient's family history includes Bladder Cancer in her maternal grandfather; COPD  in her father and mother; Dementia in her father and mother; Emphysema (age of onset: 17) in her mother; Glaucoma in her father and paternal grandfather; Kidney cancer in her maternal grandfather; Lung cancer in her father; Macular degeneration in her maternal grandmother and mother; Melanoma in her father; Neuropathy in her mother; Prostate cancer (age of onset: 59) in her father.    ROS:  Please see the history of present illness.   Otherwise, review of systems are positive for none.   All other systems are reviewed and negative.    PHYSICAL EXAM: VS:  BP (!) 142/72   Pulse 78   Ht 5\' 7"  (1.702 m)   Wt 128 lb (58.1 kg)   SpO2 100%   BMI 20.05 kg/m  , BMI Body mass index is 20.05 kg/m. GENERAL:  Well appearing HEENT:  Pupils equal round and reactive, fundi not visualized, oral mucosa unremarkable NECK:  No jugular venous distention, waveform within normal limits, carotid upstroke brisk and symmetric, no bruits LUNGS:  Clear to auscultation bilaterally HEART:  RRR.  PMI not displaced or sustained,S1 and S2 within normal limits, no S3, no S4, no clicks, no rubs, no murmurs ABD:  Flat, positive bowel sounds normal in frequency in pitch, no bruits, no rebound, no guarding, no midline pulsatile mass, no hepatomegaly, no splenomegaly EXT:  2 plus pulses throughout, no edema, no cyanosis no clubbing SKIN:  No rashes no nodules NEURO:  Cranial nerves II through XII grossly intact, motor grossly intact throughout PSYCH:  Cognitively intact, oriented to person place and time  EKG:  EKG is not ordered today.  Echo 04/16/19: 1. Left ventricular ejection fraction, by estimation, is 60 to 65%. The  left ventricle has normal function. The left ventricle has no regional  wall motion abnormalities. Left ventricular diastolic parameters are  consistent with Grade II diastolic  dysfunction (pseudonormalization). Elevated left atrial pressure. The  average left ventricular global longitudinal strain  is -22.2 %.  2. Right ventricular systolic function is normal. The right ventricular  size is normal. There is normal pulmonary artery systolic pressure. The  estimated right ventricular systolic pressure is 29.5 mmHg.  3. The mitral valve is myxomatous. Mild to moderate mitral valve  regurgitation. No evidence of mitral stenosis.  4. The aortic valve is normal in structure. Aortic valve regurgitation is  not visualized. No aortic stenosis is present.  5. The inferior vena cava is normal in size with greater than 50%  respiratory variability, suggesting right atrial pressure of 3 mmHg.   Recent Labs: 05/31/2020: Hemoglobin 13.6; Platelets 243; TSH 0.766 06/01/2020: ALT 20; BUN 11;  Creatinine, Ser 0.78; Magnesium 2.2; Potassium 4.0; Sodium 136    Lipid Panel    Component Value Date/Time   CHOL 155 06/01/2020 0248   TRIG 54 06/01/2020 0248   HDL 63 06/01/2020 0248   CHOLHDL 2.5 06/01/2020 0248   VLDL 11 06/01/2020 0248   LDLCALC 81 06/01/2020 0248      Wt Readings from Last 3 Encounters:  06/08/20 128 lb (58.1 kg)  05/31/20 128 lb (58.1 kg)  04/12/20 130 lb 3.2 oz (59.1 kg)      ASSESSMENT AND PLAN:  # TGA: Does not seem cardiac.  She did not take clopidogrel since discharge and I agree that it is okay for her not to take this long-term.  I suspect the episode was all related to the stress of the events going on with her husband at the time.  # Mitral regurgitation:  # Mitral valve prolapse: Symptoms seem stable.  There is a recommendation that she have an echo after discharge from the hospital.  We will arrange this.  # L subclavian stenosis: Moderate to severe left subclavian stenosis distal to the vertebral takeoff.  Clinically she is stable.  We will continue to watch and ensure that her LDL is less than 70.  She is scheduled to follow-up with vascular soon.  Currently there is less than 10 mmHg BP asymmetry between her left and right arms.  # Hyperlipidemia:   Check lipids/CMP in 6 months.  Continue rosuvastatin for now.  LDL goal is less than 70.  She will keep working on diet and exercise.  # Essential hypertension:  Wants to wait before increasing valsartan.  She will continue to monitor for now.  Current medicines are reviewed at length with the patient today.  The patient does not have concerns regarding medicines.  The following changes have been made:  no change  Labs/ tests ordered today include:   Orders Placed This Encounter  Procedures  . Lipid panel  . Comprehensive metabolic panel  . ECHOCARDIOGRAM COMPLETE    Time spent: 35 minutes-Greater than 50% of this time was spent in counseling, explanation of diagnosis, planning of further management, and coordination of care.  Disposition:   FU with Santiago Stenzel C. Oval Linsey, MD, [facility] in 6 months     Signed, Flor Whitacre C. Oval Linsey, MD, [facility]  06/08/2020 11:58 AM    [place]

## 2020-06-26 ENCOUNTER — Telehealth: Payer: Self-pay

## 2020-06-26 NOTE — Telephone Encounter (Signed)
We received a new referral for a patient of Dr. Allie Bossier (LS 2015 for sleep). The new referral is for TGA vs TIA. She is requesting a provider switch from Dr. Brett Fairy to Dr. Leonie Man.   Can you please advise?

## 2020-06-26 NOTE — Telephone Encounter (Signed)
Okay with me if Dr. Brett Fairy agrees

## 2020-06-27 NOTE — Telephone Encounter (Signed)
Absolutely agree with switch to Dr Leonie Man. Thank you.

## 2020-06-29 ENCOUNTER — Other Ambulatory Visit: Payer: Self-pay | Admitting: *Deleted

## 2020-06-29 DIAGNOSIS — I771 Stricture of artery: Secondary | ICD-10-CM

## 2020-07-04 ENCOUNTER — Other Ambulatory Visit: Payer: Self-pay

## 2020-07-04 ENCOUNTER — Ambulatory Visit (HOSPITAL_COMMUNITY): Payer: Medicare Other | Attending: Internal Medicine

## 2020-07-04 DIAGNOSIS — I341 Nonrheumatic mitral (valve) prolapse: Secondary | ICD-10-CM | POA: Diagnosis present

## 2020-07-04 DIAGNOSIS — I340 Nonrheumatic mitral (valve) insufficiency: Secondary | ICD-10-CM | POA: Insufficient documentation

## 2020-07-04 LAB — ECHOCARDIOGRAM COMPLETE
Area-P 1/2: 3.12 cm2
S' Lateral: 2.5 cm

## 2020-07-25 ENCOUNTER — Ambulatory Visit (HOSPITAL_COMMUNITY)
Admission: RE | Admit: 2020-07-25 | Discharge: 2020-07-25 | Disposition: A | Discharge status: Home / Self Care | Payer: Medicare Other | Source: Ambulatory Visit | Attending: Vascular Surgery | Admitting: Vascular Surgery

## 2020-07-25 ENCOUNTER — Ambulatory Visit (INDEPENDENT_AMBULATORY_CARE_PROVIDER_SITE_OTHER): Payer: Medicare Other | Admitting: Vascular Surgery

## 2020-07-25 ENCOUNTER — Other Ambulatory Visit: Payer: Self-pay

## 2020-07-25 DIAGNOSIS — I771 Stricture of artery: Secondary | ICD-10-CM | POA: Insufficient documentation

## 2020-07-25 NOTE — Progress Notes (Signed)
Patient name: Tonya Norton MRN: [id number] DOB: [birth year] Sex: female  REASON FOR CONSULT: Evaluate left subclavian stenosis  HPI: Tonya Norton is a 65 y.o. female, the presents for evaluation of left subclavian stenosis.  Patient had an event back in April when she had amnesia around her husband having a medical event.  She went to [facility] and had extensive work-up.  MRA head and neck on 05/31/2020 showed an incidental short segment moderate to severe left distal subclavian stenosis beyond the takeoff of the left vertebral artery.  Further work-up has revelaed likely transient global amnesia.  She denies any left arm claudication symptoms.  No left arm tissue loss.  Does not smoke.  She is retired Forensic psychologist in town.  Left arm BP was about 15 mm Hg lower in clinic today. Past Medical History:  Diagnosis Date   Chronic fatigue syndrome    Cystic breast    DDD (degenerative disc disease)    Depression    HSV (herpes simplex virus) infection    Hypothyroidism    Migraines    Menstrual   Mitral regurgitation    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, myxomatous mitral valve   Mitral valve prolapse    Nocturia 07/23/2013   Raynaud's disease    Rosacea    Snoring 07/23/2013   Thyroid nodule     Past Surgical History:  Procedure Laterality Date   benign right breast biopsy     BREAST EXCISIONAL BIOPSY Right    DILATION AND CURETTAGE OF UTERUS     laser to cervix     SHOULDER SURGERY Right     Family History  Problem Relation Age of Onset   Prostate cancer Father 22       3/15   Glaucoma Father    COPD Father    Lung cancer Father    Dementia Father    Melanoma Father    Emphysema Mother 42       1/19   Neuropathy Mother    COPD Mother    Dementia Mother    Macular degeneration Mother    Glaucoma Paternal Grandfather    Macular degeneration Maternal Grandmother    Bladder Cancer Maternal Grandfather    Kidney cancer Maternal Grandfather    Breast cancer Neg Hx      SOCIAL HISTORY: Social History   Socioeconomic History   Marital status: Married    Spouse name: Herbie Baltimore   Number of children: 0   Years of education: [employer] education level: Not on file  Occupational History    Employer: [employer]  Tobacco Use   Smoking status: Never   Smokeless tobacco: Never  Vaping Use   Vaping Use: Never used  Substance and Sexual Activity   Alcohol use: Yes    Comment: socially 2-3 weekly   Drug use: No   Sexual activity: Not on file  Other Topics Concern   Not on file  Social History Narrative   Patient is married Herbie Baltimore) and lives at home with her husband.   Patient has two step daughters.   Patient is working full-time.   Patient has a Cytogeneticist school)   Patient is right-handed.   Patient drinks two cups of coffee or hot tea daily.   Social Determinants of Health   Financial Resource Strain: Not on file  Food Insecurity: Not on file  Transportation Needs: Not on file  Physical Activity: Not on file  Stress: Not  on file  Social Connections: Not on file  Intimate Partner Violence: Not on file    Allergies  Allergen Reactions   Aspirin     Other reaction(s): may have caused some cough or wheeze but not totally sure in 2012 so could rechallenge if needed.    Current Outpatient Medications  Medication Sig Dispense Refill   Alpha-Lipoic Acid 200 MG CAPS Take 200 mg by mouth daily.     Ascorbic Acid (VITAMIN C) 500 MG CAPS Take 500 mg by mouth daily.     cholecalciferol (VITAMIN D3) 25 MCG (1000 UNIT) tablet Take 1,000 Units by mouth daily.     Coenzyme Q10 200 MG capsule Take 200 mg by mouth daily.     FLUoxetine (PROZAC) 20 MG tablet Take 20 mg by mouth daily.      fluticasone (FLONASE) 50 MCG/ACT nasal spray Place 2 sprays into both nostrils daily.     Multiple Vitamins-Minerals (CENTRUM SILVER 50+WOMEN PO) Take 1 tablet by mouth daily.     rosuvastatin (CRESTOR) 10 MG tablet Take 10 mg by  mouth daily.     SYNTHROID 100 MCG tablet Take 100 mcg by mouth See admin instructions. 6 days a week Monday-saturday     valsartan (DIOVAN) 80 MG tablet Take 80 mg by mouth daily.     guaiFENesin (MUCINEX) 600 MG 12 hr tablet Take 600 mg by mouth 2 (two) times daily as needed for cough. (Patient not taking: Reported on 07/25/2020)     No current facility-administered medications for this visit.    REVIEW OF SYSTEMS:  [X]  denotes positive finding, [ ]  denotes negative finding Cardiac  Comments:  Chest pain or chest pressure:    Shortness of breath upon exertion:    Short of breath when lying flat:    Irregular heart rhythm:        Vascular    Pain in calf, thigh, or hip brought on by ambulation:    Pain in feet at night that wakes you up from your sleep:     Blood clot in your veins:    Leg swelling:         Pulmonary    Oxygen at home:    Productive cough:     Wheezing:         Neurologic    Sudden weakness in arms or legs:     Sudden numbness in arms or legs:     Sudden onset of difficulty speaking or slurred speech:    Temporary loss of vision in one eye:     Problems with dizziness:         Gastrointestinal    Blood in stool:     Vomited blood:         Genitourinary    Burning when urinating:     Blood in urine:        Psychiatric    Major depression:         Hematologic    Bleeding problems:    Problems with blood clotting too easily:        Skin    Rashes or ulcers:        Constitutional    Fever or chills:      PHYSICAL EXAM: Vitals:   07/25/20 1456 07/25/20 1459  BP: 127/72 (!) 144/70  Pulse: 74 70  Resp: 14   Temp: 98 F (36.7 C)   TempSrc: Temporal   SpO2: 99%   Weight: 126 lb (57.2 kg)  Height: 5\' 7"  (1.702 m)     GENERAL: The patient is a well-nourished female, in no acute distress. The vital signs are documented above. CARDIAC: There is a regular rate and rhythm.  VASCULAR:  Bilateral upper extremity radial and brachial pulses are  palpable No upper extremity tissue loss PULMONARY: No respiratory distress. ABDOMEN: Soft and non-tender. MUSCULOSKELETAL: There are no major deformities or cyanosis. NEUROLOGIC: No focal weakness or paresthesias are detected. SKIN: There are no ulcers or rashes noted. PSYCHIATRIC: The patient has a normal affect.  DATA:   I reviewed her MRA neck and I did identify the area in question the distal left subclavian artery  Upper extremity arterial duplex today shows normal waveforms at the left wrist with minimal blood pressure difference of 6 mm Hg in the brachial artery  Assessment/Plan:  65 year old female presents with incidental left subclavian artery stenosis identified on MRA neck for work-up of amnesia event back in April 2022.  Discussed that MRA at times can over-estimate degree of stenosis.  I do not feel she needs any surgical intervention as discussed with her and her husband today.  This is an incidental finding and on her noninvasive imaging today there was only a 6 mm Hg difference between her brachial pressure in the right versus left brachial artery.  She has an easily palpable left brachial and radial pulse.  She has no left arm symptoms or fatigue.  She can follow-up as needed in the future if questions or concerns develop.  Agree her right arm BP will be more accurate.   Marty Heck, MD [facility] Office: [phone]

## 2020-10-10 ENCOUNTER — Ambulatory Visit: Payer: Medicare Other | Admitting: Neurology

## 2020-10-26 ENCOUNTER — Ambulatory Visit (INDEPENDENT_AMBULATORY_CARE_PROVIDER_SITE_OTHER): Payer: Medicare Other | Admitting: Neurology

## 2020-10-26 ENCOUNTER — Telehealth: Payer: Self-pay | Admitting: Neurology

## 2020-10-26 ENCOUNTER — Encounter: Payer: Self-pay | Admitting: Neurology

## 2020-10-26 VITALS — BP 114/64 | HR 69 | Ht 67.0 in | Wt 128.0 lb

## 2020-10-26 DIAGNOSIS — R413 Other amnesia: Secondary | ICD-10-CM

## 2020-10-26 DIAGNOSIS — G454 Transient global amnesia: Secondary | ICD-10-CM | POA: Diagnosis not present

## 2020-10-26 NOTE — Progress Notes (Signed)
[facility] [address] [place]. [place] [address] [phone]       OFFICE CONSULT NOTE  Tonya Norton Date of Birth:  Jul 02, 1955 Medical Record Number:  [id number]   Referring MD: Crist Infante  Reason for Referral: Memory loss  HPI: Ms. Buechler is a 65 year old pleasant Caucasian lady seen today for initial office consultation visit for memory loss episode.  History is obtained from the patient and her husband as well as review of electronic medical records and I personally reviewed pertinent available imaging films in PACS.  Patient has past medical history of migraines, hypothyroidism, mitral valve prolapse, chronic fatigue syndrome.  She developed an episode of sudden onset of memory loss and confusion on 05/31/2020.  Patient states she does not remember events from that day.  As per husband patient had an appointment with Dr. Amedeo Plenty with orthopedic surgeon in the morning and appointment went well after that he came home and patient's wanted to go for a walk and as they walked barely 100 yards her husband started feeling unwell.  His wife managed to prop him against a wall and call 911 and spoke to EMS patient was taken to [facility] patient's wife was advised to drive separately.  Her husband was found to have complete heart block and eventually needed a pacemaker.  Patient does not remember calling 911 and speaking to the EMS . In the emergency room patient's wife did not return several phone calls and subsequently she was found waiting in the ER and confused and asking repetitive questions despite being given the onset she was asking the same questions over and over again.  Patient was subsequently also admitted for further evaluation.  She was seen by Dr. 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  MRI scan of the brain was obtained which I personally reviewed was completely normal without any hippocampal abnormalities.  MR angiogram of the brain and  neck but did not show significant large vessel intracranial extracranial stenosis except moderate distal left subclavian stenosis after the origin of the vertebral.  Echocardiogram done later on 07/04/2020 showed normal ejection fraction of 60 to 65% with slightly impaired diastolic relaxation but no cardiac source of embolism.  Hemoglobin A1c was 5.6 and LDL cholesterol was 81 mg percent.  Patient was referred to me but took several months before appointment could be made.  Patient states that she is remembering a few patchy with self information during the day but not most of what happened.  She has no problem trouble remembering information from the day before as well as from the day after.  She is totally independent in all actives of daily living and driving and getting around routine without any issues.  She does worry about Alzheimer's as it is family history of vascular dementia in her mother and grandmother as well as her dad late in life and some memory issues.  Patient does have history of menstrual migraines as well as some rare migraine headaches only 2 or 3 episodes a year.  She does report she had episode of visual aura couple of days prior to this episode but no visual symptoms during this episode that she can remember.  She did have a mild headache that day but it was not bothersome and went away.  She has had no migraines or visual aura since then.  She has no new complaints today.  She has no known history of strokes, TIA, seizures, significant head injury with  loss of consciousness.  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, myxomatous  mitral valve   Mitral valve prolapse    Nocturia 07/23/2013   Raynaud's disease    Rosacea    Snoring 07/23/2013   Thyroid nodule     Social History:  Social History   Socioeconomic History   Marital status: Married    Spouse name: Herbie Baltimore   Number of children: 0   Years of education: [employer] education level: Not on file  Occupational History    Employer: [employer]  Tobacco Use   Smoking status: Never   Smokeless tobacco: Never  Vaping Use   Vaping Use: Never used  Substance and Sexual Activity   Alcohol use: Yes    Comment: socially 2-3 weekly   Drug use: No   Sexual activity: Not on file  Other Topics Concern   Not on file  Social History Narrative   Patient is married Herbie Baltimore) and lives at home with her husband.   Patient has two step daughters.   Patient is working full-time.   Patient has a [employer] school)   Patient is right-handed.   Patient drinks two cups of coffee or hot tea daily.   Social Determinants of Health   Financial Resource Strain: Not on file  Food Insecurity: Not on file  Transportation Needs: Not on file  Physical Activity: Not on file  Stress: Not on file  Social Connections: Not on file  Intimate Partner Violence: Not on file    Medications:   Current Outpatient Medications on File Prior to Visit  Medication Sig Dispense Refill   Alpha-Lipoic Acid 200 MG CAPS Take 200 mg by mouth daily.     Ascorbic Acid (VITAMIN C) 500 MG CAPS Take 500 mg by mouth daily.     cholecalciferol (VITAMIN D3) 25 MCG (1000 UNIT) tablet Take 1,000 Units by mouth daily.     Coenzyme Q10 200 MG capsule Take 200 mg by mouth daily.     fexofenadine-pseudoephedrine (ALLEGRA-D 24) 180-240 MG 24 hr tablet Take 1 tablet by mouth daily.     FLUoxetine (PROZAC) 20 MG tablet Take 20 mg by mouth daily.      fluticasone (FLONASE) 50 MCG/ACT nasal spray Place 2 sprays into both nostrils daily.     guaiFENesin (MUCINEX) 600 MG 12  hr tablet Take 600 mg by mouth 2 (two) times daily as needed for cough.     Multiple Vitamins-Minerals (CENTRUM SILVER 50+WOMEN PO) Take 1 tablet by mouth daily.     rosuvastatin (CRESTOR) 10 MG tablet Take 10 mg by mouth daily.     SYNTHROID 100 MCG tablet Take 100 mcg by mouth See admin instructions. 6 days a week Monday-saturday     valsartan (DIOVAN) 80 MG tablet Take 80 mg by mouth daily.     No current facility-administered medications on file prior to visit.    Allergies:   Allergies  Allergen Reactions   Aspirin     Other reaction(s): may have caused some cough  or wheeze but not totally sure in 2012 so could rechallenge if needed.    Physical Exam General: Frail middle-age Caucasian lady, seated, in no evident distress Head: head normocephalic and atraumatic.   Neck: supple with no carotid or supraclavicular bruits Cardiovascular: regular rate and rhythm, no murmurs Musculoskeletal: no deformity Skin:  no rash/petichiae Vascular:  Normal pulses all extremities  Neurologic Exam Mental Status: Awake and fully alert. Oriented to place and time. Recent and remote memory intact. Attention span, concentration and fund of knowledge appropriate. Mood and affect appropriate.  Diminished recall 1/3 initially with me but subsequently while testing Mini-Mental with my assistant she scored 30/30 with no deficits.  Able to name 14 animals which can walk on 4 legs.  Clock drawing 4/4.  Geriatric depression scale 0 not depressed. Cranial Nerves: Fundoscopic exam reveals sharp disc margins. Pupils equal, briskly reactive to light. Extraocular movements full without nystagmus. Visual fields full to confrontation. Hearing intact. Facial sensation intact. Face, tongue, palate moves normally and symmetrically.  Motor: Normal bulk and tone. Normal strength in all tested extremity muscles. Sensory.: intact to touch , pinprick , position and vibratory sensation.  Coordination: Rapid alternating  movements normal in all extremities. Finger-to-nose and heel-to-shin performed accurately bilaterally. Gait and Station: Arises from chair without difficulty. Stance is normal. Gait demonstrates normal stride length and balance . Able to heel, toe and tandem walk with only minor difficulty.  Reflexes: 1+ and symmetric. Toes downgoing.       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  She does have a strong family history of dementia but no major cognitive concerns at present.  She has history of migraine headaches but they are not quite active or frequent to justify medications at present.     PLAN: I had a long discussion with the patient and her husband regarding her episode of memory loss which likely represent transient global amnesia.  She is still having some subtle memory difficulties and given her family history of dementia I recommend further evaluation by checking memory panel labs and EEG.  We discussed memory compensation strategies and advised her to increase participation in cognitively challenging activities like solving crossword puzzles, playing bridge and sodoku.  She will return for follow-up in the future in 3 months or call earlier if necessary.  Greater than 50% time during this 45-minute consultation visit was spent on counseling and coordination of care about episode of memory loss and answering questions. Antony Contras, MD Note: This document was prepared with digital dictation and possible smart phrase technology. Any transcriptional errors that result from this process are unintentional.

## 2020-10-26 NOTE — Patient Instructions (Signed)
I had a long discussion with the patient and her husband regarding her episode of memory loss which likely represent transient global amnesia.  She is still having some subtle memory difficulties and given her family history of dementia I recommend further evaluation by checking memory panel labs and EEG.  We discussed memory compensation strategies and advised her to increase participation in cognitively challenging activities like solving crossword puzzles, playing bridge and sodoku.  She will return for follow-up in the future in 3 months or call earlier if necessary. Memory Compensation Strategies  Use "WARM" strategy.  W= write it down  A= associate it  R= repeat it  M= make a mental note  2.   You can keep a Social worker.  Use a 3-ring notebook with sections for the following: calendar, important names and phone numbers,  medications, doctors' names/phone numbers, lists/reminders, and a section to journal what you did  each day.   3.    Use a calendar to write appointments down.  4.    Write yourself a schedule for the day.  This can be placed on the calendar or in a separate section of the Memory Notebook.  Keeping a  regular schedule can help memory.  5.    Use medication organizer with sections for each day or morning/evening pills.  You may need help loading it  6.    Keep a basket, or pegboard by the door.  Place items that you need to take out with you in the basket or on the pegboard.  You may also want to  include a message board for reminders.  7.    Use sticky notes.  Place sticky notes with reminders in a place where the task is performed.  For example: " turn off the  stove" placed by the stove, "[place] the door" placed on the door at eye level, " take your medications" on  the bathroom mirror or by the place where you normally take your medications.  8.    Use alarms/timers.  Use while cooking to remind yourself to check on food or as a reminder to take your medicine,  or as a  reminder to make a call, or as a reminder to perform another task, etc.

## 2020-10-26 NOTE — Telephone Encounter (Signed)
EEG order sent to [facility] for scheduling.

## 2020-10-27 LAB — DEMENTIA PANEL
Homocysteine: 9.7 umol/L (ref 0.0–17.2)
RPR Ser Ql: NONREACTIVE
TSH: 1.49 u[IU]/mL (ref 0.450–4.500)
Vitamin B-12: 798 pg/mL (ref 232–1245)

## 2020-10-30 ENCOUNTER — Encounter: Payer: Self-pay | Admitting: Emergency Medicine

## 2020-10-30 NOTE — Progress Notes (Signed)
Patient sent a note that her weight was actually 128 pounds, not the 120 pounds that was recorded during her visit.  Corrected patient's weight.

## 2020-11-08 ENCOUNTER — Ambulatory Visit: Payer: Medicare Other | Attending: Internal Medicine

## 2020-11-08 ENCOUNTER — Other Ambulatory Visit (HOSPITAL_BASED_OUTPATIENT_CLINIC_OR_DEPARTMENT_OTHER): Payer: Self-pay

## 2020-11-08 DIAGNOSIS — Z23 Encounter for immunization: Secondary | ICD-10-CM

## 2020-11-08 MED ORDER — MODERNA COVID-19 BIVAL BOOSTER 50 MCG/0.5ML IM SUSP
INTRAMUSCULAR | 0 refills | Status: AC
  Filled 2020-11-08: qty 0.5, 1d supply, fill #0

## 2020-11-08 NOTE — Progress Notes (Signed)
   Covid-19 Vaccination Clinic  Name:  VARNIKA BUTZ    MRN: [id number] DOB: April 17, 1955  11/08/2020  Ms. Cruces was observed post Covid-19 immunization for 15 minutes without incident. She was provided with Vaccine Information Sheet and instruction to access the V-Safe system.   Ms. Perona was instructed to call 911 with any severe reactions post vaccine: Difficulty breathing  Swelling of face and throat  A fast heartbeat  A bad rash all over body  Dizziness and weakness

## 2020-11-14 ENCOUNTER — Ambulatory Visit (INDEPENDENT_AMBULATORY_CARE_PROVIDER_SITE_OTHER): Payer: Medicare Other | Admitting: Neurology

## 2020-11-14 DIAGNOSIS — R410 Disorientation, unspecified: Secondary | ICD-10-CM

## 2020-11-14 DIAGNOSIS — G454 Transient global amnesia: Secondary | ICD-10-CM

## 2020-11-29 LAB — COMPREHENSIVE METABOLIC PANEL
ALT: 15 IU/L (ref 0–32)
AST: 25 IU/L (ref 0–40)
Albumin/Globulin Ratio: 1.8 (ref 1.2–2.2)
Albumin: 4.4 g/dL (ref 3.8–4.8)
Alkaline Phosphatase: 86 IU/L (ref 44–121)
BUN/Creatinine Ratio: 20 (ref 12–28)
BUN: 15 mg/dL (ref 8–27)
Bilirubin Total: 0.4 mg/dL (ref 0.0–1.2)
CO2: 23 mmol/L (ref 20–29)
Calcium: 9.5 mg/dL (ref 8.7–10.3)
Chloride: 103 mmol/L (ref 96–106)
Creatinine, Ser: 0.76 mg/dL (ref 0.57–1.00)
Globulin, Total: 2.5 g/dL (ref 1.5–4.5)
Glucose: 91 mg/dL (ref 70–99)
Potassium: 4.9 mmol/L (ref 3.5–5.2)
Sodium: 138 mmol/L (ref 134–144)
Total Protein: 6.9 g/dL (ref 6.0–8.5)
eGFR: 86 mL/min/{1.73_m2} (ref >59)

## 2020-11-29 LAB — LIPID PANEL
Chol/HDL Ratio: 2 ratio (ref 0.0–4.4)
Cholesterol, Total: 154 mg/dL (ref 100–199)
HDL: 76 mg/dL (ref >39)
LDL Chol Calc (NIH): 64 mg/dL (ref 0–99)
Triglycerides: 73 mg/dL (ref 0–149)
VLDL Cholesterol Cal: 14 mg/dL (ref 5–40)

## 2020-12-04 NOTE — Progress Notes (Signed)
Cardiology Office Note   Date:  12/05/2020   ID:  Tonya Norton, DOB 07-07-1955, MRN [id number]  PCP:  Crist Infante, MD  Cardiologist:   Skeet Latch, MD   No chief complaint on file.    History of Present Illness: Tonya Norton is a 65 y.o. female with hypertension, hyperlipidemia, and mitral valve prolapse who is being seen today for follow-up. 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  She was seen in the hospital 06/2020.  Her husband had a syncopal episode that morning.  She called 911 and followed the ambulance in her car.  Upon arrival she was confused and disoriented.  Her husband ultimately needed a pacemaker implanted.  She was evaluated and had no neurologic deficits on imaging.  She had an echo 04/2019 that revealed LVEF 60 to 65% with grade 2 diastolic dysfunction.  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  The echo was otherwise unremarkable.  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  They could not completely rule out TIA so they recommended one month of clopidogrel.  She filled the prescription but decided not to take it.  Of note, her MR-A incidentally noted a short segment of moderate to severe left distal subclavian artery stenosis.  She has not noted any left arm numbness, tingling, pain, or weakness.  She has been checking her blood pressures in both arms and the left arm is usually about 6-8 points lower than the right.  Dr. Joylene Draft arranged for her to see a vascular specialist.  At her last appointment, Tonya Norton was doing well. Her blood pressure was above goal but she wanted to wait before adjusting her medication. Today, she is accompanied by a family member. She is feeling fine overall and has no new complaints. Occasionally she checks her blood pressure at home, and it generally seems to be  well-controlled. For exercise she is walking every day for 30-45 minutes with rare exceptions. In the past year she has been exercising more consistently and is feeling better. She denies any palpitations, chest pain, or shortness of breath. No lightheadedness, headaches, syncope, orthopnea, PND, lower extremity edema or exertional symptoms.  Past Medical History:  Diagnosis Date   Chronic fatigue syndrome    Cystic breast    DDD (degenerative disc disease)    Depression    HSV (herpes simplex virus) infection    Hypothyroidism    Migraines    Menstrual   Mitral regurgitation    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, myxomatous mitral valve   Mitral regurgitation 12/05/2020   Mitral valve prolapse    Nocturia 07/23/2013   Raynaud's disease    Rosacea    Snoring 07/23/2013   Thyroid nodule     Past Surgical History:  Procedure Laterality Date   benign right breast biopsy     BREAST EXCISIONAL BIOPSY Right    DILATION AND CURETTAGE OF UTERUS     laser to cervix     SHOULDER SURGERY Right      Current Outpatient Medications  Medication Sig Dispense Refill   Alpha-Lipoic Acid 200 MG CAPS Take 200 mg by mouth daily.     Ascorbic Acid (VITAMIN C) 500 MG CAPS Take 500 mg by mouth daily.     cholecalciferol (VITAMIN D3) 25 MCG (1000 UNIT) tablet Take 1,000 Units by mouth daily.  Coenzyme Q10 200 MG capsule Take 200 mg by mouth daily.     COVID-19 mRNA bivalent vaccine, Moderna, (MODERNA COVID-19 BIVAL BOOSTER) 50 MCG/0.5ML injection Inject into the muscle. 0.5 mL 0   estradiol (ESTRACE) 0.1 MG/GM vaginal cream SMARTSIG:1 Applicator Vaginal Twice a Week     fexofenadine-pseudoephedrine (ALLEGRA-D 24) 180-240 MG 24 hr tablet Take 1 tablet by mouth daily.     FLUoxetine (PROZAC) 20 MG tablet Take 20 mg by mouth daily.      fluticasone (FLONASE) 50 MCG/ACT nasal spray Place 2 sprays into both nostrils daily.     guaiFENesin (MUCINEX) 600 MG 12 hr tablet Take 600 mg by mouth 2 (two) times daily as  needed for cough.     Multiple Vitamins-Minerals (CENTRUM SILVER 50+WOMEN PO) Take 1 tablet by mouth daily.     rosuvastatin (CRESTOR) 10 MG tablet Take 10 mg by mouth daily.     SYNTHROID 100 MCG tablet Take 100 mcg by mouth See admin instructions. 6 days a week Monday-saturday     valsartan (DIOVAN) 80 MG tablet Take 80 mg by mouth daily.     No current facility-administered medications for this visit.    Allergies:   Aspirin    Social History:  The patient  reports that she has never smoked. She has never used smokeless tobacco. She reports current alcohol use. She reports that she does not use drugs.   Family History:  The patient's family history includes Bladder Cancer in her maternal grandfather; COPD in her father and mother; Dementia in her father and mother; Emphysema (age of onset: 40) in her mother; Glaucoma in her father and paternal grandfather; Kidney cancer in her maternal grandfather; Lung cancer in her father; Macular degeneration in her maternal grandmother and mother; Melanoma in her father; Neuropathy in her mother; Prostate cancer (age of onset: 53) in her father.    ROS:   Please see the history of present illness. All other systems are reviewed and negative.    PHYSICAL EXAM: VS:  BP 112/62 (BP Location: Right Arm, Patient Position: Sitting, Cuff Size: Normal)   Pulse 69   Ht 5\' 7"  (1.702 m)   Wt 130 lb 9.6 oz (59.2 kg)   BMI 20.45 kg/m  , BMI Body mass index is 20.45 kg/m. GENERAL:  Well appearing HEENT:  Pupils equal round and reactive, fundi not visualized, oral mucosa unremarkable NECK:  No jugular venous distention, waveform within normal limits, carotid upstroke brisk and symmetric, no bruits LUNGS:  Clear to auscultation bilaterally HEART:  RRR.  PMI not displaced or sustained,S1 and S2 within normal limits, no S3, no S4, no clicks, no rubs, 2/6 systolic murmur ABD:  Flat, positive bowel sounds normal in frequency in pitch, no bruits, no rebound, no  guarding, no midline pulsatile mass, no hepatomegaly, no splenomegaly EXT:  2 plus pulses throughout, no edema, no cyanosis no clubbing SKIN:  No rashes no nodules NEURO:  Cranial nerves II through XII grossly intact, motor grossly intact throughout PSYCH:  Cognitively intact, oriented to person place and time  EKG:   12/05/2020: Sinus rhythm. Rate 69 bpm. 06/08/2020: EKG was not ordered.  UE Doppler 07/25/2020: Summary:     Right: No significant arterial obstruction detected in the right         upper extremity.  Left: No significant arterial obstruction detected in the left upper        extremity.  Echo 07/04/2020:  1. Left ventricular ejection fraction, by estimation,  is 60 to 65%. The  left ventricle has normal function. The left ventricle has no regional  wall motion abnormalities. Left ventricular diastolic parameters are  consistent with Grade I diastolic  dysfunction (impaired relaxation). The average left ventricular global  longitudinal strain is -22.6 %. The global longitudinal strain is normal.   2. Right ventricular systolic function is normal. The right ventricular  size is normal.   3. The mitral valve is abnormal. Mild mitral valve regurgitation. There  is mild late systolic prolapse of the middle scallop of the posterior  leaflet of the mitral valve.   4. The aortic valve is tricuspid. Aortic valve regurgitation is not  visualized.   5. The inferior vena cava is normal in size with greater than 50%  respiratory variability, suggesting right atrial pressure of 3 mmHg.   Comparison(s): Changes from prior study are noted. 04/16/19 EF 60-65%. GLS  -22%. PA pressure 46mmHg. Mild-moderate MR.   MRA Head and Neck 05/31/2020: FINDINGS: MRI HEAD FINDINGS   Brain: Cerebral volume within normal limits for patient age. No focal parenchymal signal abnormality identified.   No abnormal foci of restricted diffusion to suggest acute or subacute ischemia. Gray-white matter  differentiation well maintained. No encephalomalacia to suggest chronic infarction. No foci of susceptibility artifact to suggest acute or chronic intracranial hemorrhage.   No mass lesion, midline shift or mass effect. No hydrocephalus. No extra-axial fluid collection. Major dural sinuses are grossly patent.   Pituitary gland and suprasellar region are normal. Midline structures intact and normal.   Vascular: Major intracranial vascular flow voids well maintained and normal in appearance.   Skull and upper cervical spine: Craniocervical junction normal. Visualized upper cervical spine within normal limits. Bone marrow signal intensity normal. No scalp soft tissue abnormality.   Sinuses/Orbits: Globes and orbital soft tissues within normal limits.   Paranasal sinuses are clear. No mastoid effusion. Inner ear structures normal.   Other: None.   MRA HEAD FINDINGS   ANTERIOR CIRCULATION:   Visualized distal cervical segments of the internal carotid arteries are widely patent with antegrade flow. Petrous, cavernous, and supraclinoid segments widely patent without stenosis or other abnormality. Left A1 segment widely patent. Right A1 segment hypoplastic and/or absent, accounting for the diminutive right ICA is compared to the left. Normal anterior communicating artery complex. Anterior cerebral arteries patent to their distal aspects without stenosis. No M1 stenosis or occlusion. Normal MCA bifurcations. Distal MCA branches well perfused and symmetric.   POSTERIOR CIRCULATION:   Strongly dominant left vertebral artery, with hypoplastic right vertebral artery. Both visualized V4 segments widely patent. Left PICA origin patent and normal. Right PICA of also appears to be patent, seen only on corresponding MRA neck portion of this exam. Basilar patent to its distal aspect without stenosis. Superior cerebellar arteries patent bilaterally. Both PCAs primarily supplied via the  basilar. PCAs well perfused to their distal aspects without stenosis. Right P2 segment branches early.   No intracranial aneurysm or other vascular abnormality.   MRA NECK FINDINGS   AORTIC ARCH: Visualized aortic arch of normal caliber. Bovine branching pattern with common origin of the innominate and left common carotid artery noted. No hemodynamically significant stenosis seen about the origin of the great vessels.   RIGHT CAROTID SYSTEM: Right common and internal carotid arteries widely patent without stenosis, evidence for dissection or occlusion. No significant atheromatous narrowing or irregularity about the right carotid bifurcation.   LEFT CAROTID SYSTEM: Left common and internal carotid arteries widely patent without stenosis,  evidence for dissection or occlusion. No significant atheromatous narrowing or irregularity about the left carotid bifurcation.   VERTEBRAL ARTERIES: Both vertebral arteries arise from the subclavian arteries. No proximal subclavian artery stenosis. Left vertebral artery dominant. Incidental note made of a moderate to severe distal left subclavian artery stenosis (series 1047, image 10), well beyond the takeoff of the left vertebral artery. Vertebral arteries widely patent within the neck without stenosis, evidence for dissection or occlusion.   IMPRESSION: MRI HEAD IMPRESSION:   Normal brain MRI.  No acute intracranial abnormality identified.   MRA HEAD IMPRESSION:   Normal intracranial MRA. No large vessel occlusion, hemodynamically significant stenosis, or other acute vascular abnormality. No aneurysm.   MRA NECK IMPRESSION:   1. Normal MRA of the neck with wide patency of both carotid artery systems and vertebral arteries. 2. Incidental short-segment moderate to severe distal left subclavian artery stenosis as above. This is well beyond the takeoff of the left vertebral artery.  Calcium Score 05/06/2019: FINDINGS: Non-cardiac: See  separate report from [facility].   Ascending Aorta: Normal.  Mild aortic root calcification.   Pericardium: Normal   Coronary arteries: Normal origin.   IMPRESSION: Coronary calcium score of 16.9. This was percentile 46 for age and sex matched control.   There is mild aortic root calcification.  Echo 04/16/19:  1. Left ventricular ejection fraction, by estimation, is 60 to 65%. The  left ventricle has normal function. The left ventricle has no regional  wall motion abnormalities. Left ventricular diastolic parameters are  consistent with Grade II diastolic  dysfunction (pseudonormalization). Elevated left atrial pressure. The  average left ventricular global longitudinal strain is -22.2 %.   2. Right ventricular systolic function is normal. The right ventricular  size is normal. There is normal pulmonary artery systolic pressure. The  estimated right ventricular systolic pressure is 27.0 mmHg.   3. The mitral valve is myxomatous. Mild to moderate mitral valve  regurgitation. No evidence of mitral stenosis.   4. The aortic valve is normal in structure. Aortic valve regurgitation is  not visualized. No aortic stenosis is present.   5. The inferior vena cava is normal in size with greater than 50%  respiratory variability, suggesting right atrial pressure of 3 mmHg.   Recent Labs: 05/31/2020: Hemoglobin 13.6; Platelets 243 06/01/2020: Magnesium 2.2 10/26/2020: TSH 1.490 11/29/2020: ALT 15; BUN 15; Creatinine, Ser 0.76; Potassium 4.9; Sodium 138    Lipid Panel    Component Value Date/Time   CHOL 154 11/29/2020 0925   TRIG 73 11/29/2020 0925   HDL 76 11/29/2020 0925   CHOLHDL 2.0 11/29/2020 0925   CHOLHDL 2.5 06/01/2020 0248   VLDL 11 06/01/2020 0248   LDLCALC 64 11/29/2020 0925     Wt Readings from Last 3 Encounters:  12/05/20 130 lb 9.6 oz (59.2 kg)  10/26/20 128 lb (58.1 kg)  07/25/20 126 lb (57.2 kg)      ASSESSMENT AND PLAN:  HTN (hypertension) Blood  pressure is very well-controlled.  Continue valsartan.  Stenosis of left subclavian artery (HCC) She is asymptomatic.  She saw Dr. Carlis Abbott and no plans for any intervention.  Continue rosuvastatin with LDL goal less than 70.  HLD (hyperlipidemia) Lipids are well-controlled.  Continue rosuvastatin.  Mitral regurgitation She has a myxomatous mitral valve.  She had mild to moderate mitral gravitation.  Only mild murmur heard on exam today and her last echo 06/2020 had only mild mitral regurgitation.  She is euvolemic.  Continue to monitor.  Current medicines are reviewed at length with the patient today.  The patient does not have concerns regarding medicines.  The following changes have been made:  no change  Labs/ tests ordered today include:   Orders Placed This Encounter  Procedures   EKG 12-Lead      Disposition:   FU with Rashed Edler C. Oval Linsey, MD, [facility] in 1 year.   I,Mathew Stumpf,acting as a Education administrator for Skeet Latch, MD.,have documented all relevant documentation on the behalf of Skeet Latch, MD,as directed by  Skeet Latch, MD while in the presence of Skeet Latch, MD.  I, [place] Oval Linsey, MD have reviewed all documentation for this visit.  The documentation of the exam, diagnosis, procedures, and orders on 12/05/2020 are all accurate and complete.   Signed, [employer] C. Oval Linsey, MD, [facility]  12/05/2020 9:12 AM    [place]

## 2020-12-05 ENCOUNTER — Other Ambulatory Visit: Payer: Self-pay

## 2020-12-05 ENCOUNTER — Encounter (HOSPITAL_BASED_OUTPATIENT_CLINIC_OR_DEPARTMENT_OTHER): Payer: Self-pay | Admitting: Cardiovascular Disease

## 2020-12-05 ENCOUNTER — Ambulatory Visit (INDEPENDENT_AMBULATORY_CARE_PROVIDER_SITE_OTHER): Payer: Medicare Other | Admitting: Cardiovascular Disease

## 2020-12-05 VITALS — BP 112/62 | HR 69 | Ht 67.0 in | Wt 130.6 lb

## 2020-12-05 DIAGNOSIS — I10 Essential (primary) hypertension: Secondary | ICD-10-CM

## 2020-12-05 DIAGNOSIS — I771 Stricture of artery: Secondary | ICD-10-CM

## 2020-12-05 DIAGNOSIS — E785 Hyperlipidemia, unspecified: Secondary | ICD-10-CM

## 2020-12-05 DIAGNOSIS — I340 Nonrheumatic mitral (valve) insufficiency: Secondary | ICD-10-CM | POA: Diagnosis not present

## 2020-12-05 HISTORY — DX: Nonrheumatic mitral (valve) insufficiency: I34.0

## 2020-12-05 NOTE — Assessment & Plan Note (Signed)
Blood pressure is very well-controlled.  Continue valsartan.

## 2020-12-05 NOTE — Assessment & Plan Note (Signed)
She has a myxomatous mitral valve.  She had mild to moderate mitral gravitation.  Only mild murmur heard on exam today and her last echo 06/2020 had only mild mitral regurgitation.  She is euvolemic.  Continue to monitor.

## 2020-12-05 NOTE — Patient Instructions (Signed)
Medication Instructions:  ?Your physician recommends that you continue on your current medications as directed. Please refer to the Current Medication list given to you today.  ? ?*If you need a refill on your cardiac medications before your next appointment, please call your pharmacy* ? ?Lab Work: ?NONE ? ?Testing/Procedures: ?NONE ? ?Follow-Up: ?At [facility], you and your health needs are our priority.  As part of our continuing mission to provide you with exceptional heart care, we have created designated Provider Care Teams.  These Care Teams include your primary Cardiologist (physician) and Advanced Practice Providers (APPs -  Physician Assistants and Nurse Practitioners) who all work together to provide you with the care you need, when you need it. ? ?We recommend signing up for the patient portal called "MyChart".  Sign up information is provided on this After Visit Summary.  MyChart is used to connect with patients for Virtual Visits (Telemedicine).  Patients are able to view lab/test results, encounter notes, upcoming appointments, etc.  Non-urgent messages can be sent to your provider as well.   ?To learn more about what you can do with MyChart, go to [email or web address].   ? ?Your next appointment:   ?12 month(s) ? ?The format for your next appointment:   ?In Person ? ?Provider:   ?Tiffany Tappen, MD  ? ? ? ? ? ? ?

## 2020-12-05 NOTE — Assessment & Plan Note (Signed)
Lipids are well-controlled.  Continue rosuvastatin.

## 2020-12-05 NOTE — Assessment & Plan Note (Signed)
She is asymptomatic.  She saw Dr. Carlis Abbott and no plans for any intervention.  Continue rosuvastatin with LDL goal less than 70.

## 2021-01-22 ENCOUNTER — Other Ambulatory Visit: Payer: Self-pay | Admitting: Obstetrics

## 2021-01-22 DIAGNOSIS — Z1231 Encounter for screening mammogram for malignant neoplasm of breast: Secondary | ICD-10-CM

## 2021-02-27 ENCOUNTER — Ambulatory Visit: Payer: Medicare Other | Admitting: Neurology

## 2021-03-01 ENCOUNTER — Ambulatory Visit (INDEPENDENT_AMBULATORY_CARE_PROVIDER_SITE_OTHER): Payer: Medicare Other | Admitting: Neurology

## 2021-03-01 ENCOUNTER — Encounter: Payer: Self-pay | Admitting: Neurology

## 2021-03-01 VITALS — BP 133/69 | HR 76 | Ht 67.0 in | Wt 131.2 lb

## 2021-03-01 DIAGNOSIS — G454 Transient global amnesia: Secondary | ICD-10-CM | POA: Diagnosis not present

## 2021-03-01 DIAGNOSIS — R413 Other amnesia: Secondary | ICD-10-CM

## 2021-03-01 NOTE — Patient Instructions (Signed)
I had a long discussion with the patient regarding her episode of transient global amnesia last year and discussed risks for Alzheimer's and answered questions.  We have reviewed results of lab work and EEG as well.  She seems to doing quite well I do not believe further diagnostic testing, specific treatment or even scheduled follow-up with me is necessary.  She was advised to increase participation in cognitively challenging activities like solving crossword puzzles, playing bridge and sodoku.  She may return for follow-up in the future only as needed and no schedule appointment was made.

## 2021-03-01 NOTE — Progress Notes (Signed)
[facility] [address] [place]. [place] [address] [phone]       OFFICE FOLLOW UP VISIT NOTE  Ms. RIFKY LAPRE Date of Birth:  March 20, 1955 Medical Record Number:  [id number]   Referring MD: Crist Infante  Reason for Referral: Memory loss  HPI: Initial visit 10/26/2020 :Tonya Norton is a 66 year old pleasant Caucasian lady seen today for initial office consultation visit for memory loss episode.  History is obtained from the patient and her husband as well as review of electronic medical records and I personally reviewed pertinent available imaging films in PACS.  Patient has past medical history of migraines, hypothyroidism, mitral valve prolapse, chronic fatigue syndrome.  She developed an episode of sudden onset of memory loss and confusion on 05/31/2020.  Patient states she does not remember events from that day.  As per husband patient had an appointment with Dr. Amedeo Plenty with orthopedic surgeon in the morning and appointment went well after that he came home and patient's wanted to go for a walk and as they walked barely 100 yards her husband started feeling unwell.  His wife managed to prop him against a wall and call 911 and spoke to EMS patient was taken to [facility] patient's wife was advised to drive separately.  Her husband was found to have complete heart block and eventually needed a pacemaker.  Patient does not remember calling 911 and speaking to the EMS . In the emergency room patient's wife did not return several phone calls and subsequently she was found waiting in the ER and confused and asking repetitive questions despite being given the onset she was asking the same questions over and over again.  Patient was subsequently also admitted for further evaluation.  She was seen by Dr. 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  MRI scan of the brain was obtained which I personally reviewed was completely normal without any hippocampal abnormalities.   MR angiogram of the brain and neck but did not show significant large vessel intracranial extracranial stenosis except moderate distal left subclavian stenosis after the origin of the vertebral.  Echocardiogram done later on 07/04/2020 showed normal ejection fraction of 60 to 65% with slightly impaired diastolic relaxation but no cardiac source of embolism.  Hemoglobin A1c was 5.6 and LDL cholesterol was 81 mg percent.  Patient was referred to me but took several months before appointment could be made.  Patient states that she is remembering a few patchy with self information during the day but not most of what happened.  She has no problem trouble remembering information from the day before as well as from the day after.  She is totally independent in all actives of daily living and driving and getting around routine without any issues.  She does worry about Alzheimer's as it is family history of vascular dementia in her mother and grandmother as well as her dad late in life and some memory issues.  Patient does have history of menstrual migraines as well as some rare migraine headaches only 2 or 3 episodes a year.  She does report she had episode of visual aura couple of days prior to this episode but no visual symptoms during this episode that she can remember.  She did have a mild headache that day but it was not bothersome and went away.  She has had no migraines or visual aura since then.  She has no new complaints today.  She has no known history of strokes, TIA,  seizures, significant head injury with loss of consciousness. Update 03/01/2021: She returns for follow-up after last visit 4 months ago.  She states she is doing well.  She has had no further episodes of memory loss or confusion.  She had lab work done at last visit which showed normal vitamin B12, TSH and RPR was negative.  EEG on 11/20/2020 was normal.  She recently had a yearly physical exam by her primary physician Dr. Haynes Kerns and lab work and  everything was fine.  She also saw Dr. Oval Linsey in November and had a good visit and last echocardiogram had shown improvement in her diastolic dysfunction.  She also has mild mitral valve prolapse.  Her blood pressure is well controlled and she is on valsartan.  She has no new complaints.  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, myxomatous mitral valve   Mitral regurgitation 12/05/2020   Mitral valve prolapse    Nocturia 07/23/2013   Raynaud's disease    Rosacea    Snoring 07/23/2013   Thyroid nodule     Social History:  Social History   Socioeconomic History   Marital status: Married    Spouse name: Herbie Baltimore   Number of children: 0   Years of education: [employer] education level: Not on file  Occupational History    Employer: [employer]  Tobacco Use   Smoking status: Never   Smokeless tobacco: Never  Vaping Use   Vaping Use: Never used  Substance and Sexual Activity   Alcohol use: Yes    Comment: socially 2-3 weekly   Drug use: No   Sexual activity: Not on file  Other Topics Concern   Not on file  Social History Narrative   Patient is married Herbie Baltimore) and lives at home with her husband.   Patient has two step daughters.   Patient is working full-time.   Patient has a [employer] school)   Patient is right-handed.   Patient drinks two cups of coffee or hot tea daily.   Social Determinants of Health   Financial Resource Strain: Low Risk    Difficulty of Paying Living Expenses: Not hard at all  Food Insecurity: No  Food Insecurity   Worried About Charity fundraiser in the Last Year: Never true   [place] in the Last Year: Never true  Transportation Needs: Unknown   Lack of Transportation (Medical): No   Lack of Transportation (Non-Medical): Not on file  Physical Activity: Sufficiently Active   Days of Exercise per Week: 5 days   Minutes of Exercise per Session: 40 min  Stress: Not on file  Social Connections: Not on file  Intimate Partner Violence: Not on file    Medications:   Current Outpatient Medications on File Prior to Visit  Medication Sig Dispense Refill   Alpha-Lipoic Acid 200 MG CAPS Take 200 mg by mouth daily.     Ascorbic Acid (VITAMIN C) 500 MG CAPS Take 500 mg  by mouth daily.     cholecalciferol (VITAMIN D3) 25 MCG (1000 UNIT) tablet Take 1,000 Units by mouth daily.     Coenzyme Q10 200 MG capsule Take 200 mg by mouth daily.     COVID-19 mRNA bivalent vaccine, Moderna, (MODERNA COVID-19 BIVAL BOOSTER) 50 MCG/0.5ML injection Inject into the muscle. 0.5 mL 0   estradiol (ESTRACE) 0.1 MG/GM vaginal cream SMARTSIG:1 Applicator Vaginal Twice a Week     fexofenadine (ALLEGRA) 180 MG tablet Take 180 mg by mouth as needed for allergies or rhinitis.     FLUoxetine (PROZAC) 20 MG tablet Take 20 mg by mouth daily.      fluticasone (FLONASE) 50 MCG/ACT nasal spray Place 2 sprays into both nostrils daily.     guaiFENesin (MUCINEX) 600 MG 12 hr tablet Take 600 mg by mouth 2 (two) times daily as needed for cough.     Multiple Vitamins-Minerals (CENTRUM SILVER 50+WOMEN PO) Take 1 tablet by mouth daily.     rosuvastatin (CRESTOR) 10 MG tablet Take 10 mg by mouth daily.     SYNTHROID 100 MCG tablet Take 100 mcg by mouth See admin instructions. 6 days a week Monday-saturday     valsartan (DIOVAN) 80 MG tablet Take 80 mg by mouth daily.     No current facility-administered medications on file prior to visit.    Allergies:   Allergies  Allergen Reactions   Aspirin     Other reaction(s):  may have caused some cough or wheeze but not totally sure in 2012 so could rechallenge if needed.    Physical Exam General: Frail middle-age Caucasian lady, seated, in no evident distress Head: head normocephalic and atraumatic.   Neck: supple with no carotid or supraclavicular bruits Cardiovascular: regular rate and rhythm, no murmurs Musculoskeletal: no deformity Skin:  no rash/petichiae Vascular:  Normal pulses all extremities  Neurologic Exam Mental Status: Awake and fully alert. Oriented to place and time. Recent and remote memory intact. Attention span, concentration and fund of knowledge appropriate. Mood and affect appropriate.  Diminished recall 2/3 initially . Mini-Mental at last visit she scored 30/30 with no deficits.  Able to name 14 animals which can walk on 4 legs.  Clock drawing 4/4.  Marland Kitchen Cranial Nerves: Fundoscopic exam reveals sharp disc margins. Pupils equal, briskly reactive to light. Extraocular movements full without nystagmus. Visual fields full to confrontation. Hearing intact. Facial sensation intact. Face, tongue, palate moves normally and symmetrically.  Motor: Normal bulk and tone. Normal strength in all tested extremity muscles. Sensory.: intact to touch , pinprick , position and vibratory sensation.  Coordination: Rapid alternating movements normal in all extremities. Finger-to-nose and heel-to-shin performed accurately bilaterally. Gait and Station: Arises from chair without difficulty. Stance is normal. Gait demonstrates normal stride length and balance . Able to heel, toe and tandem walk with only minor difficulty.  Reflexes: 1+ and symmetric. Toes downgoing.       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  She does have a strong family history of dementia but no major cognitive concerns at present.  She has history of migraine headaches but they are not quite active  or frequent to justify medications at present.     PLAN: I had a long discussion with the patient regarding her episode of transient global amnesia last year and discussed risks for Alzheimer's and answered questions.  We have reviewed results of lab work and EEG as well.  She seems to doing quite well I do not believe further diagnostic testing, specific treatment or even scheduled follow-up with me is necessary.  She was advised to increase participation in cognitively challenging activities like solving crossword puzzles, playing bridge and sodoku.  She may return for follow-up in the future only as needed and no schedule appointment was made.Greater than 50% time during this 35-minute  visit was spent on counseling and coordination of care about episode of memory loss and answering questions. Antony Contras, MD Note: This document was prepared with digital dictation and possible smart phrase technology. Any transcriptional errors that result from this process are unintentional.

## 2021-03-21 ENCOUNTER — Other Ambulatory Visit: Payer: Self-pay

## 2021-03-21 ENCOUNTER — Ambulatory Visit
Admission: RE | Admit: 2021-03-21 | Discharge: 2021-03-21 | Disposition: A | Discharge status: Home / Self Care | Payer: Medicare Other | Source: Ambulatory Visit | Attending: Obstetrics | Admitting: Obstetrics

## 2021-03-21 DIAGNOSIS — Z1231 Encounter for screening mammogram for malignant neoplasm of breast: Secondary | ICD-10-CM

## 2021-06-01 ENCOUNTER — Ambulatory Visit: Payer: Medicare Other | Attending: Internal Medicine

## 2021-06-01 ENCOUNTER — Other Ambulatory Visit (HOSPITAL_BASED_OUTPATIENT_CLINIC_OR_DEPARTMENT_OTHER): Payer: Self-pay

## 2021-06-01 DIAGNOSIS — Z23 Encounter for immunization: Secondary | ICD-10-CM

## 2021-06-01 MED ORDER — MODERNA COVID-19 BIVAL BOOSTER 50 MCG/0.5ML IM SUSP
INTRAMUSCULAR | 0 refills | Status: AC
  Filled 2021-06-01: qty 0.5, 1d supply, fill #0

## 2021-06-01 NOTE — Progress Notes (Signed)
? ?  Covid-19 Vaccination Clinic ? ?Name:  Tonya Norton    ?MRN: [id number] ?DOB: 10-10-1955 ? ?06/01/2021 ? ?Tonya Norton was observed post Covid-19 immunization for 15 minutes without incident. She was provided with Vaccine Information Sheet and instruction to access the V-Safe system.  ? ?Tonya Norton was instructed to call 911 with any severe reactions post vaccine: ?Difficulty breathing  ?Swelling of face and throat  ?A fast heartbeat  ?A bad rash all over body  ?Dizziness and weakness  ? ?Immunizations Administered   ? ? Name Date Dose VIS Date Route  ? Moderna Covid-19 vaccine Bivalent Booster 06/01/2021 11:12 AM 0.5 mL 09/16/2020 Intramuscular  ? Manufacturer: Moderna  ? Lot: [id number]  ? [place]: [phone]  ? ?  ? ?

## 2021-10-12 ENCOUNTER — Other Ambulatory Visit (HOSPITAL_BASED_OUTPATIENT_CLINIC_OR_DEPARTMENT_OTHER): Payer: Self-pay

## 2021-10-12 MED ORDER — AREXVY 120 MCG/0.5ML IM SUSR
INTRAMUSCULAR | 0 refills | Status: AC
  Filled 2021-10-12: qty 0.5, 1d supply, fill #0

## 2021-10-15 ENCOUNTER — Other Ambulatory Visit (HOSPITAL_BASED_OUTPATIENT_CLINIC_OR_DEPARTMENT_OTHER): Payer: Self-pay

## 2022-02-13 ENCOUNTER — Other Ambulatory Visit: Payer: Self-pay | Admitting: Obstetrics

## 2022-02-13 DIAGNOSIS — Z1231 Encounter for screening mammogram for malignant neoplasm of breast: Secondary | ICD-10-CM

## 2022-02-14 ENCOUNTER — Ambulatory Visit (INDEPENDENT_AMBULATORY_CARE_PROVIDER_SITE_OTHER): Payer: Medicare Other | Admitting: Cardiovascular Disease

## 2022-02-14 ENCOUNTER — Encounter (HOSPITAL_BASED_OUTPATIENT_CLINIC_OR_DEPARTMENT_OTHER): Payer: Self-pay | Admitting: Cardiovascular Disease

## 2022-02-14 VITALS — BP 110/64 | HR 66 | Ht 67.0 in | Wt 129.1 lb

## 2022-02-14 DIAGNOSIS — I10 Essential (primary) hypertension: Secondary | ICD-10-CM

## 2022-02-14 DIAGNOSIS — E785 Hyperlipidemia, unspecified: Secondary | ICD-10-CM | POA: Diagnosis not present

## 2022-02-14 DIAGNOSIS — I340 Nonrheumatic mitral (valve) insufficiency: Secondary | ICD-10-CM | POA: Diagnosis not present

## 2022-02-14 DIAGNOSIS — I771 Stricture of artery: Secondary | ICD-10-CM

## 2022-02-14 NOTE — Assessment & Plan Note (Signed)
She is asymptomatic.  Continue to monitor for numbness, tingling, or weakness.  Lipids are very well-controlled.  She will have them rechecked with her PCP later this month.

## 2022-02-14 NOTE — Patient Instructions (Signed)
Medication Instructions:  The current medical regimen is effective;  continue present plan and medications.   *If you need a refill on your cardiac medications before your next appointment, please call your pharmacy*   Lab Work: None  If you have labs (blood work) drawn today and your tests are completely normal, you will receive your results only by: [place] (if you have MyChart) OR A paper copy in the mail If you have any lab test that is abnormal or we need to change your treatment, we will call you to review the results.   Testing/Procedures: None   Follow-Up: At [facility], you and your health needs are our priority.  As part of our continuing mission to provide you with exceptional heart care, we have created designated Provider Care Teams.  These Care Teams include your primary Cardiologist (physician) and Advanced Practice Providers (APPs -  Physician Assistants and Nurse Practitioners) who all work together to provide you with the care you need, when you need it.  We recommend signing up for the patient portal called "MyChart".  Sign up information is provided on this After Visit Summary.  MyChart is used to connect with patients for Virtual Visits (Telemedicine).  Patients are able to view lab/test results, encounter notes, upcoming appointments, etc.  Non-urgent messages can be sent to your provider as well.   To learn more about what you can do with MyChart, go to [email or web address].    Your next appointment:   12 month(s)  Provider:   Skeet Latch, MD  Other Instructions None

## 2022-02-14 NOTE — Assessment & Plan Note (Signed)
Continue rosuvastatin.  LDL goal is less than 70.  She will get lab work done with Dr. Joylene Draft later this month.

## 2022-02-14 NOTE — Assessment & Plan Note (Signed)
Mild-moderate regurgitation on echo.  She has mild mitral valve prolapse.  No need for antibiotic prophylaxis.  No repeat Pete imaging unless she develops heart failure symptoms.

## 2022-02-14 NOTE — Assessment & Plan Note (Signed)
Blood pressure is very well-controlled.  It was lower today in the office than it usually is at home.  She has mild orthostatic symptoms with exercise when getting from the floor up to standing.  She will monitor her blood pressure more closely and we will consider reducing her valsartan.  However in general, she thinks it is running in the 120s we will not make any changes at this time.

## 2022-02-14 NOTE — Progress Notes (Signed)
Cardiology Office Note  Date:  02/14/2022   ID:  Tonya Norton, DOB 06/02/1955, MRN [id number]  PCP:  Crist Infante, MD  Cardiologist:   Skeet Latch, MD   No chief complaint on file.    History of Present Illness: Tonya Norton is a 67 y.o. female with hypertension, hyperlipidemia, and mitral valve prolapse who is being seen today for follow-up. 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  She was seen in the hospital 06/2020.  Her husband had a syncopal episode that morning.  She called 911 and followed the ambulance in her car.  Upon arrival she was confused and disoriented.  Her husband ultimately needed a pacemaker implanted.  She was evaluated and had no neurologic deficits on imaging.  She had an echo 04/2019 that revealed LVEF 60 to 65% with grade 2 diastolic dysfunction.  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  The echo was otherwise unremarkable.  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  They could not completely rule out TIA so they recommended one month of clopidogrel.  She filled the prescription but decided not to take it.  Of note, her MR-A incidentally noted a short segment of moderate to severe left distal subclavian artery stenosis.  She has not noted any left arm numbness, tingling, pain, or weakness.  She has been checking her blood pressures in both arms and the left arm is usually about 6-8 points lower than the right.  Dr. Joylene Draft arranged for her to see a vascular specialist.  Today, she is overall doing well. She had a previous mild breakout of shingles by her eye, despite having the vaccine. In addition, around September she had a GERD exacerbation but is doing much better now. She has been eating chocolate to help.   She stays active by walking and doing floor exercises (stretching, bridges). On occasion,  when she gets up from the floor she feels a bit lightheaded. She notices that it also drops her blood pressure. She notes that this only occurs when she gets up from the floor, not when going from sitting to standing.  She denies any palpitations, chest pain, shortness of breath, or peripheral edema. No lightheadedness, headaches, syncope, orthopnea, or PND.  Past Medical History:  Diagnosis Date   Chronic fatigue syndrome    Cystic breast    DDD (degenerative disc disease)    Depression    HSV (herpes simplex virus) infection    Hypothyroidism    Migraines    Menstrual   Mitral regurgitation    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, myxomatous mitral valve   Mitral regurgitation 12/05/2020   Mitral valve prolapse    Nocturia 07/23/2013   Raynaud's disease    Rosacea    Snoring 07/23/2013   Thyroid nodule     Past Surgical History:  Procedure Laterality Date   benign right breast biopsy     BREAST EXCISIONAL BIOPSY Right    DILATION AND CURETTAGE OF UTERUS     laser to cervix     SHOULDER SURGERY Right      Current Outpatient Medications  Medication Sig Dispense Refill   Alpha-Lipoic Acid 200 MG CAPS Take 200 mg by mouth daily.     Ascorbic Acid (VITAMIN C) 500 MG CAPS Take 500 mg by mouth daily.     cholecalciferol (VITAMIN D3) 25  MCG (1000 UNIT) tablet Take 1,000 Units by mouth daily.     Coenzyme Q10 200 MG capsule Take 200 mg by mouth daily.     COVID-19 mRNA bivalent vaccine, Moderna, (MODERNA COVID-19 BIVAL BOOSTER) 50 MCG/0.5ML injection Inject into the muscle. 0.5 mL 0   COVID-19 mRNA bivalent vaccine, Moderna, (MODERNA COVID-19 BIVAL BOOSTER) 50 MCG/0.5ML injection Inject into the muscle. 0.5 mL 0   estradiol (ESTRACE) 0.1 MG/GM vaginal cream SMARTSIG:1 Applicator Vaginal Twice a Week     fexofenadine (ALLEGRA) 180 MG tablet Take 180 mg by mouth as needed for allergies or rhinitis.     FLUoxetine (PROZAC) 20 MG tablet Take 20 mg by mouth daily.      fluticasone (FLONASE) 50  MCG/ACT nasal spray Place 2 sprays into both nostrils daily.     guaiFENesin (MUCINEX) 600 MG 12 hr tablet Take 600 mg by mouth 2 (two) times daily as needed for cough.     Multiple Vitamins-Minerals (CENTRUM SILVER 50+WOMEN PO) Take 1 tablet by mouth daily.     rosuvastatin (CRESTOR) 10 MG tablet Take 10 mg by mouth daily.     RSV vaccine recomb adjuvanted (AREXVY) 120 MCG/0.5ML injection Inject into the muscle. 0.5 mL 0   SYNTHROID 100 MCG tablet Take 100 mcg by mouth See admin instructions. 6 days a week Monday-saturday     valsartan (DIOVAN) 80 MG tablet Take 80 mg by mouth daily.     No current facility-administered medications for this visit.    Allergies:   Aspirin    Social History:  The patient  reports that she has never smoked. She has never used smokeless tobacco. She reports current alcohol use. She reports that she does not use drugs.   Family History:  The patient's family history includes Bladder Cancer in her maternal grandfather; COPD in her father and mother; Dementia in her father and mother; Emphysema (age of onset: 4) in her mother; Glaucoma in her father and paternal grandfather; Kidney cancer in her maternal grandfather; Lung cancer in her father; Macular degeneration in her maternal grandmother and mother; Melanoma in her father; Neuropathy in her mother; Prostate cancer (age of onset: 22) in her father.    ROS:   Please see the history of present illness.  All other systems are reviewed and negative.    PHYSICAL EXAM: VS:  BP 110/64 (BP Location: Right Arm, Patient Position: Sitting, Cuff Size: Normal)   Pulse 66   Ht '5\' 7"'$  (1.702 m)   Wt 129 lb 1.6 oz (58.6 kg)   BMI 20.22 kg/m  , BMI Body mass index is 20.22 kg/m. GENERAL:  Well appearing HEENT:  Pupils equal round and reactive, fundi not visualized, oral mucosa unremarkable NECK:  No jugular venous distention, waveform within normal limits, carotid upstroke brisk and symmetric, no bruits LUNGS:  Clear  to auscultation bilaterally HEART:  RRR.  PMI not displaced or sustained,S1 and S2 within normal limits, no S3, no S4, no clicks, no rubs, 2/6 systolic murmur ABD:  Flat, positive bowel sounds normal in frequency in pitch, no bruits, no rebound, no guarding, no midline pulsatile mass, no hepatomegaly, no splenomegaly EXT:  2 plus pulses throughout, no edema, no cyanosis no clubbing SKIN:  No rashes no nodules NEURO:  Cranial nerves II through XII grossly intact, motor grossly intact throughout PSYCH:  Cognitively intact, oriented to person place and time  EKG:  EKG is personally reviewed. 02/14/2022: Sinus rhythm. Rate 66 bpm. 12/05/2020: Sinus rhythm. Rate 69  bpm. 06/08/2020: EKG was not ordered.  UE Doppler 07/25/2020: Summary:     Right: No significant arterial obstruction detected in the right         upper extremity.  Left: No significant arterial obstruction detected in the left upper        extremity.  Echo 07/04/2020:  1. Left ventricular ejection fraction, by estimation, is 60 to 65%. The  left ventricle has normal function. The left ventricle has no regional  wall motion abnormalities. Left ventricular diastolic parameters are  consistent with Grade I diastolic  dysfunction (impaired relaxation). The average left ventricular global  longitudinal strain is -22.6 %. The global longitudinal strain is normal.   2. Right ventricular systolic function is normal. The right ventricular  size is normal.   3. The mitral valve is abnormal. Mild mitral valve regurgitation. There  is mild late systolic prolapse of the middle scallop of the posterior  leaflet of the mitral valve.   4. The aortic valve is tricuspid. Aortic valve regurgitation is not  visualized.   5. The inferior vena cava is normal in size with greater than 50%  respiratory variability, suggesting right atrial pressure of 3 mmHg.   Comparison(s): Changes from prior study are noted. 04/16/19 EF 60-65%. GLS  -22%. PA  pressure 51mHg. Mild-moderate MR.   Calcium Score 05/06/2019: IMPRESSION: Coronary calcium score of 16.9. This was percentile 673for age and sex matched control.   There is mild aortic root calcification.  Echo 04/16/19:  1. Left ventricular ejection fraction, by estimation, is 60 to 65%. The  left ventricle has normal function. The left ventricle has no regional  wall motion abnormalities. Left ventricular diastolic parameters are  consistent with Grade II diastolic  dysfunction (pseudonormalization). Elevated left atrial pressure. The  average left ventricular global longitudinal strain is -22.2 %.   2. Right ventricular systolic function is normal. The right ventricular  size is normal. There is normal pulmonary artery systolic pressure. The  estimated right ventricular systolic pressure is 232.6mmHg.   3. The mitral valve is myxomatous. Mild to moderate mitral valve  regurgitation. No evidence of mitral stenosis.   4. The aortic valve is normal in structure. Aortic valve regurgitation is  not visualized. No aortic stenosis is present.   5. The inferior vena cava is normal in size with greater than 50%  respiratory variability, suggesting right atrial pressure of 3 mmHg.   Recent Labs: No results found for requested labs within last 365 days.    Lipid Panel    Component Value Date/Time   CHOL 154 11/29/2020 0925   TRIG 73 11/29/2020 0925   HDL 76 11/29/2020 0925   CHOLHDL 2.0 11/29/2020 0925   CHOLHDL 2.5 06/01/2020 0248   VLDL 11 06/01/2020 0248   LDLCALC 64 11/29/2020 0925     Wt Readings from Last 3 Encounters:  02/14/22 129 lb 1.6 oz (58.6 kg)  03/01/21 131 lb 3.2 oz (59.5 kg)  12/05/20 130 lb 9.6 oz (59.2 kg)     ASSESSMENT AND PLAN:  Stenosis of left subclavian artery (HCC) She is asymptomatic.  Continue to monitor for numbness, tingling, or weakness.  Lipids are very well-controlled.  She will have them rechecked with her PCP later this month.  Mitral  regurgitation Mild-moderate regurgitation on echo.  She has mild mitral valve prolapse.  No need for antibiotic prophylaxis.  No repeat Pete imaging unless she develops heart failure symptoms.  HTN (hypertension) Blood pressure is very well-controlled.  It was lower today in the office than it usually is at home.  She has mild orthostatic symptoms with exercise when getting from the floor up to standing.  She will monitor her blood pressure more closely and we will consider reducing her valsartan.  However in general, she thinks it is running in the 120s we will not make any changes at this time.  HLD (hyperlipidemia) Continue rosuvastatin.  LDL goal is less than 70.  She will get lab work done with Dr. Joylene Draft later this month.   Current medicines are reviewed at length with the patient today.  The patient does not have concerns regarding medicines.  The following changes have been made:  no change  Labs/ tests ordered today include:   Orders Placed This Encounter  Procedures   EKG 12-Lead    Disposition:   FU with Cayne Yom C. Oval Linsey, MD, [facility] in 1 year.    I,Rachel Rivera,acting as a Education administrator for Skeet Latch, MD.,have documented all relevant documentation on the behalf of Skeet Latch, MD,as directed by  Skeet Latch, MD while in the presence of Skeet Latch, MD.  I, [place] Oval Linsey, MD have reviewed all documentation for this visit.  The documentation of the exam, diagnosis, procedures, and orders on 02/14/2022 are all accurate and complete.   Signed, Terrace Chiem C. Oval Linsey, MD, [facility]  02/14/2022 10:28 AM    [place]

## 2022-03-22 ENCOUNTER — Encounter (HOSPITAL_BASED_OUTPATIENT_CLINIC_OR_DEPARTMENT_OTHER): Payer: Self-pay

## 2022-03-25 ENCOUNTER — Telehealth: Payer: Self-pay

## 2022-03-25 NOTE — Telephone Encounter (Signed)
Pt called requesting advice on her upcoming appt.  Reviewed pt's chart, returned call for clarification, two identifiers used. Informed her that the appt would be a consultation only and if the proposed temporal artery bx was agreed upon, it would be set up via surgery scheduling. All pt questions answered. Confirmed understanding.

## 2022-03-26 ENCOUNTER — Other Ambulatory Visit: Payer: Self-pay

## 2022-03-26 ENCOUNTER — Encounter: Payer: Self-pay | Admitting: Vascular Surgery

## 2022-03-26 ENCOUNTER — Ambulatory Visit (INDEPENDENT_AMBULATORY_CARE_PROVIDER_SITE_OTHER): Payer: Medicare Other | Admitting: Vascular Surgery

## 2022-03-26 VITALS — BP 140/69 | HR 71 | Temp 97.8°F | Resp 14 | Ht 67.0 in | Wt 129.0 lb

## 2022-03-26 DIAGNOSIS — R519 Headache, unspecified: Secondary | ICD-10-CM

## 2022-03-26 NOTE — Progress Notes (Signed)
Patient name: Tonya Norton MRN: [id number] DOB: Mar 07, 1955 Sex: female  REASON FOR CONSULT: Evaluate for right temporal artery biopsy  HPI: FAYA VERONICA is a 67 y.o. female,  with history hypertension, hyperlipidemia, mitral valve prolapse that presents for evaluation of right temporal artery biopsy.  Patient initially had right temporal tenderness with some associated headache that started in November 2023.  She was initially told this was related to shingles.  Her CRP and ESR were normal at that time.  Then several weeks ago she had recurrent episode with right temporal tenderness as well as a transient episode of right eye vision loss.  She was seen by Dr. Delman Cheadle her ophthalmologist who did not find anything abnormal with a normal eye exam.  Apparently after this visit she had repeat labs that showed an elevated sed rate of 27.  She is seeing rheumatology with Dr. Amil Amen who recommend a right temporal artery biopsy and referred her here to vascular surgery.  She has no left temporal tenderness only on the right side.  Past Medical History:  Diagnosis Date   Chronic fatigue syndrome    Cystic breast    DDD (degenerative disc disease)    Depression    HSV (herpes simplex virus) infection    Hypothyroidism    Migraines    Menstrual   Mitral regurgitation    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, myxomatous mitral valve   Mitral regurgitation 12/05/2020   Mitral valve prolapse    Nocturia 07/23/2013   Raynaud's disease    Rosacea    Snoring 07/23/2013   Thyroid nodule     Past Surgical History:  Procedure Laterality Date   benign right breast biopsy     BREAST EXCISIONAL BIOPSY Right    DILATION AND CURETTAGE OF UTERUS     laser to cervix     SHOULDER SURGERY Right     Family History  Problem Relation Age of Onset   Prostate cancer Father 59       3/15   Glaucoma Father    COPD Father    Lung cancer Father    Dementia Father    Melanoma Father    Emphysema Mother 75       1/19    Neuropathy Mother    COPD Mother    Dementia Mother    Macular degeneration Mother    Glaucoma Paternal Grandfather    Macular degeneration Maternal Grandmother    Bladder Cancer Maternal Grandfather    Kidney cancer Maternal Grandfather    Breast cancer Neg Hx     SOCIAL HISTORY: Social History   Socioeconomic History   Marital status: Married    Spouse name: Herbie [place]   Number of children: 0   Years of education: [employer] education level: Not on file  Occupational History    Employer: [employer]  Tobacco Use   Smoking status: Never   Smokeless tobacco: Never  Vaping Use   Vaping Use: Never used  Substance and Sexual Activity   Alcohol use: Yes    Comment: socially 2-3 weekly   Drug use: No   Sexual activity: Not on file  Other Topics Concern   Not on file  Social History Narrative   Patient is married Herbie [place]) and lives at home with her husband.   Patient has two step daughters.   Patient is working full-time.   Patient has a Cytogeneticist school)   Patient is right-handed.  Patient drinks two cups of coffee or hot tea daily.   Social Determinants of Health   Financial Resource Strain: Low Risk  (12/05/2020)   Overall Financial Resource Strain (CARDIA)    Difficulty of Paying Living Expenses: Not hard at all  Food Insecurity: No Food Insecurity (12/05/2020)   Hunger Vital Sign    Worried About Running Out of Food in the Last Year: Never true    Ran Out of Food in the Last Year: Never true  Transportation Needs: Unknown (12/05/2020)   PRAPARE - Hydrologist (Medical): No    Lack of Transportation (Non-Medical): Not on file  Physical Activity: Sufficiently Active (12/05/2020)   Exercise Vital Sign    Days of Exercise per Week: 5 days    Minutes of Exercise per Session: 40 min  Stress: Not on file  Social Connections: Not on file  Intimate Partner Violence: Not on file    Allergies  Allergen  Reactions   Aspirin     Other reaction(s): may have caused some cough or wheeze but not totally sure in 2012 so could rechallenge if needed.    Current Outpatient Medications  Medication Sig Dispense Refill   Alpha-Lipoic Acid 200 MG CAPS Take 200 mg by mouth daily.     Ascorbic Acid (VITAMIN C) 500 MG CAPS Take 500 mg by mouth daily.     cholecalciferol (VITAMIN D3) 25 MCG (1000 UNIT) tablet Take 1,000 Units by mouth daily.     Coenzyme Q10 200 MG capsule Take 300 mg by mouth daily.     COVID-19 mRNA bivalent vaccine, Moderna, (MODERNA COVID-19 BIVAL BOOSTER) 50 MCG/0.5ML injection Inject into the muscle. 0.5 mL 0   COVID-19 mRNA bivalent vaccine, Moderna, (MODERNA COVID-19 BIVAL BOOSTER) 50 MCG/0.5ML injection Inject into the muscle. 0.5 mL 0   estradiol (ESTRACE) 0.1 MG/GM vaginal cream SMARTSIG:1 Applicator Vaginal Twice a Week     fexofenadine (ALLEGRA) 180 MG tablet Take 180 mg by mouth as needed for allergies or rhinitis.     FLUoxetine (PROZAC) 20 MG tablet Take 20 mg by mouth daily.      fluticasone (FLONASE) 50 MCG/ACT nasal spray Place 2 sprays into both nostrils daily.     guaiFENesin (MUCINEX) 600 MG 12 hr tablet Take 600 mg by mouth 2 (two) times daily as needed for cough.     Multiple Vitamins-Minerals (CENTRUM SILVER 50+WOMEN PO) Take 1 tablet by mouth daily.     rosuvastatin (CRESTOR) 10 MG tablet Take 10 mg by mouth daily.     RSV vaccine recomb adjuvanted (AREXVY) 120 MCG/0.5ML injection Inject into the muscle. 0.5 mL 0   SYNTHROID 100 MCG tablet Take 100 mcg by mouth See admin instructions. 6 days a week Monday-saturday     valsartan (DIOVAN) 80 MG tablet Take 80 mg by mouth daily.     No current facility-administered medications for this visit.    REVIEW OF SYSTEMS:  [X]$  denotes positive finding, [ ]$  denotes negative finding Cardiac  Comments:  Chest pain or chest pressure:    Shortness of breath upon exertion:    Short of breath when lying flat:    Irregular  heart rhythm:        Vascular    Pain in calf, thigh, or hip brought on by ambulation:    Pain in feet at night that wakes you up from your sleep:     Blood clot in your veins:    Leg swelling:  Pulmonary    Oxygen at home:    Productive cough:     Wheezing:         Neurologic    Sudden weakness in arms or legs:     Sudden numbness in arms or legs:     Sudden onset of difficulty speaking or slurred speech:    Temporary loss of vision in one eye:     Problems with dizziness:         Gastrointestinal    Blood in stool:     Vomited blood:         Genitourinary    Burning when urinating:     Blood in urine:        Psychiatric    Major depression:         Hematologic    Bleeding problems:    Problems with blood clotting too easily:        Skin    Rashes or ulcers:        Constitutional    Fever or chills:      PHYSICAL EXAM: Vitals:   03/26/22 1329  BP: (!) 140/69  Pulse: 71  Resp: 14  Temp: 97.8 F (36.6 C)  TempSrc: Temporal  SpO2: 99%  Weight: 129 lb (58.5 kg)  Height: 5' 7"$  (1.702 m)    GENERAL: The patient is a well-nourished female, in no acute distress. The vital signs are documented above. CARDIAC: There is a regular rate and rhythm.  VASCULAR:  Mild right temporal tenderness with palpable temporal pulse No left temporal tenderness.  Palpable left temporal pulse PULMONARY: No respiratory distress. ABDOMEN: Soft and non-tender. MUSCULOSKELETAL: There are no major deformities or cyanosis. NEUROLOGIC: No focal weakness or paresthesias are detected. PSYCHIATRIC: The patient has a normal affect.  DATA:   N/A  Assessment/Plan:   67 y.o. female,  with history hypertension, hyperlipidemia, mitral valve prolapse that presents for evaluation of right temporal artery biopsy.  I reviewed her records and I think it would be reasonable to proceed with right temporal artery biopsy.  She had right temporal tenderness with some headaches that started  back in November 2023 when she was told this was related to shingles.  This ultimately got better and then she had a second episode that started several weeks ago and she has been seen by rheumatology as well as ophthalmology.  Rheumatology is now recommending right temporal artery biopsy.  I discussed basic steps of the operation as well as risk benefits.  Discussed this is the gold standard to diagnose temporal arteritis.  She wishes to proceed.  Will get her scheduled for early next week.   Marty Heck, MD [facility] Office: [phone]

## 2022-03-29 ENCOUNTER — Other Ambulatory Visit: Payer: Self-pay

## 2022-03-29 ENCOUNTER — Encounter (HOSPITAL_COMMUNITY): Payer: Self-pay | Admitting: Vascular Surgery

## 2022-03-29 NOTE — Progress Notes (Addendum)
Tonya Norton denies chest pain or shortness of breath. Patient denies having any s/s of Covid in her household, also denies any known exposure to Covid. Tonya Norton PCP is Dr. Crist Infante, cardiologist is Dr. Jonelle Sidle.

## 2022-04-01 ENCOUNTER — Encounter (HOSPITAL_COMMUNITY): Payer: Self-pay | Admitting: Vascular Surgery

## 2022-04-01 ENCOUNTER — Ambulatory Visit (HOSPITAL_COMMUNITY): Payer: Medicare Other | Admitting: Registered Nurse

## 2022-04-01 ENCOUNTER — Other Ambulatory Visit: Payer: Self-pay

## 2022-04-01 ENCOUNTER — Encounter (HOSPITAL_COMMUNITY)
Admission: RE | Disposition: A | Discharge status: Home / Self Care | Payer: Self-pay | Source: Ambulatory Visit | Attending: Vascular Surgery

## 2022-04-01 ENCOUNTER — Ambulatory Visit (HOSPITAL_COMMUNITY)
Admission: RE | Admit: 2022-04-01 | Discharge: 2022-04-01 | Disposition: A | Discharge status: Home / Self Care | Payer: Medicare Other | Source: Ambulatory Visit | Attending: Vascular Surgery | Admitting: Vascular Surgery

## 2022-04-01 ENCOUNTER — Ambulatory Visit (HOSPITAL_BASED_OUTPATIENT_CLINIC_OR_DEPARTMENT_OTHER): Payer: Medicare Other | Admitting: Registered Nurse

## 2022-04-01 DIAGNOSIS — I341 Nonrheumatic mitral (valve) prolapse: Secondary | ICD-10-CM | POA: Diagnosis not present

## 2022-04-01 DIAGNOSIS — I739 Peripheral vascular disease, unspecified: Secondary | ICD-10-CM | POA: Insufficient documentation

## 2022-04-01 DIAGNOSIS — E785 Hyperlipidemia, unspecified: Secondary | ICD-10-CM | POA: Insufficient documentation

## 2022-04-01 DIAGNOSIS — I10 Essential (primary) hypertension: Secondary | ICD-10-CM | POA: Insufficient documentation

## 2022-04-01 DIAGNOSIS — E039 Hypothyroidism, unspecified: Secondary | ICD-10-CM | POA: Insufficient documentation

## 2022-04-01 DIAGNOSIS — M316 Other giant cell arteritis: Secondary | ICD-10-CM | POA: Diagnosis not present

## 2022-04-01 DIAGNOSIS — R519 Headache, unspecified: Secondary | ICD-10-CM

## 2022-04-01 DIAGNOSIS — H53121 Transient visual loss, right eye: Secondary | ICD-10-CM | POA: Diagnosis not present

## 2022-04-01 HISTORY — DX: Gastro-esophageal reflux disease without esophagitis: K21.9

## 2022-04-01 HISTORY — DX: Anxiety disorder, unspecified: F41.9

## 2022-04-01 HISTORY — DX: Cardiac murmur, unspecified: R01.1

## 2022-04-01 HISTORY — DX: Concussion with loss of consciousness status unknown, initial encounter: S06.0XAA

## 2022-04-01 HISTORY — DX: Essential (primary) hypertension: I10

## 2022-04-01 HISTORY — PX: ARTERY BIOPSY: SHX891

## 2022-04-01 HISTORY — DX: Family history of other specified conditions: Z84.89

## 2022-04-01 LAB — SURGICAL PCR SCREEN
MRSA, PCR: NEGATIVE
Staphylococcus aureus: NEGATIVE

## 2022-04-01 SURGERY — BIOPSY TEMPORAL ARTERY
Anesthesia: General | Laterality: Right

## 2022-04-01 MED ORDER — MIDAZOLAM HCL 2 MG/2ML IJ SOLN
INTRAMUSCULAR | Status: AC
  Filled 2022-04-01: qty 2

## 2022-04-01 MED ORDER — ONDANSETRON HCL 4 MG/2ML IJ SOLN: 4.0000 mg | Freq: Once | INTRAMUSCULAR | Status: DC | PRN

## 2022-04-01 MED ORDER — ONDANSETRON HCL 4 MG/2ML IJ SOLN
INTRAMUSCULAR | Status: AC
  Filled 2022-04-01: qty 2

## 2022-04-01 MED ORDER — SODIUM CHLORIDE 0.9 % IV SOLN: INTRAVENOUS | Status: DC

## 2022-04-01 MED ORDER — MIDAZOLAM HCL 5 MG/5ML IJ SOLN
INTRAMUSCULAR | Status: DC | PRN
  Administered 2022-04-01: .5 mg via INTRAVENOUS

## 2022-04-01 MED ORDER — OXYCODONE HCL 5 MG/5ML PO SOLN: 5.0000 mg | Freq: Once | ORAL | Status: DC | PRN

## 2022-04-01 MED ORDER — FENTANYL CITRATE (PF) 100 MCG/2ML IJ SOLN: 25.0000 ug | INTRAMUSCULAR | Status: DC | PRN

## 2022-04-01 MED ORDER — PHENYLEPHRINE 80 MCG/ML (10ML) SYRINGE FOR IV PUSH (FOR BLOOD PRESSURE SUPPORT)
PREFILLED_SYRINGE | INTRAVENOUS | Status: DC | PRN
  Administered 2022-04-01: 80 ug via INTRAVENOUS

## 2022-04-01 MED ORDER — ONDANSETRON HCL 4 MG/2ML IJ SOLN
INTRAMUSCULAR | Status: DC | PRN
  Administered 2022-04-01: 4 mg via INTRAVENOUS

## 2022-04-01 MED ORDER — ORAL CARE MOUTH RINSE: 15.0000 mL | Freq: Once | OROMUCOSAL | Status: AC

## 2022-04-01 MED ORDER — LIDOCAINE-EPINEPHRINE (PF) 1 %-1:200000 IJ SOLN
INTRAMUSCULAR | Status: AC
  Filled 2022-04-01: qty 30

## 2022-04-01 MED ORDER — LIDOCAINE HCL (PF) 1 % IJ SOLN
INTRAMUSCULAR | Status: AC
  Filled 2022-04-01: qty 30

## 2022-04-01 MED ORDER — CEFAZOLIN SODIUM-DEXTROSE 2-4 GM/100ML-% IV SOLN
2.0000 g | INTRAVENOUS | Status: AC
  Administered 2022-04-01: 2 g via INTRAVENOUS
  Filled 2022-04-01: qty 100

## 2022-04-01 MED ORDER — CHLORHEXIDINE GLUCONATE 4 % EX LIQD: 60.0000 mL | Freq: Once | CUTANEOUS | Status: DC

## 2022-04-01 MED ORDER — CHLORHEXIDINE GLUCONATE 0.12 % MT SOLN: 15.0000 mL | Freq: Once | OROMUCOSAL | Status: AC

## 2022-04-01 MED ORDER — PROPOFOL 10 MG/ML IV BOLUS
INTRAVENOUS | Status: DC | PRN
  Administered 2022-04-01 (×2): 30 mg via INTRAVENOUS

## 2022-04-01 MED ORDER — FENTANYL CITRATE (PF) 250 MCG/5ML IJ SOLN
INTRAMUSCULAR | Status: DC | PRN
  Administered 2022-04-01: 25 ug via INTRAVENOUS

## 2022-04-01 MED ORDER — LIDOCAINE 2% (20 MG/ML) 5 ML SYRINGE
INTRAMUSCULAR | Status: AC
  Filled 2022-04-01: qty 5

## 2022-04-01 MED ORDER — PROPOFOL 500 MG/50ML IV EMUL
INTRAVENOUS | Status: DC | PRN
  Administered 2022-04-01: 100 ug/kg/min via INTRAVENOUS

## 2022-04-01 MED ORDER — 0.9 % SODIUM CHLORIDE (POUR BTL) OPTIME
TOPICAL | Status: DC | PRN
  Administered 2022-04-01: 1000 mL

## 2022-04-01 MED ORDER — HYDROCODONE-ACETAMINOPHEN 5-325 MG PO TABS: 1.0000 | ORAL_TABLET | Freq: Four times a day (QID) | ORAL | 0 refills | Status: DC | PRN

## 2022-04-01 MED ORDER — OXYCODONE HCL 5 MG PO TABS: 5.0000 mg | ORAL_TABLET | Freq: Once | ORAL | Status: DC | PRN

## 2022-04-01 MED ORDER — LIDOCAINE HCL (PF) 1 % IJ SOLN
INTRAMUSCULAR | Status: DC | PRN
  Administered 2022-04-01: 5 mL

## 2022-04-01 MED ORDER — PHENYLEPHRINE 80 MCG/ML (10ML) SYRINGE FOR IV PUSH (FOR BLOOD PRESSURE SUPPORT)
PREFILLED_SYRINGE | INTRAVENOUS | Status: AC
  Filled 2022-04-01: qty 10

## 2022-04-01 MED ORDER — CHLORHEXIDINE GLUCONATE 0.12 % MT SOLN
OROMUCOSAL | Status: AC
  Administered 2022-04-01: 15 mL via OROMUCOSAL
  Filled 2022-04-01: qty 15

## 2022-04-01 MED ORDER — LIDOCAINE 2% (20 MG/ML) 5 ML SYRINGE
INTRAMUSCULAR | Status: DC | PRN
  Administered 2022-04-01: 40 mg via INTRAVENOUS

## 2022-04-01 MED ORDER — FENTANYL CITRATE (PF) 250 MCG/5ML IJ SOLN
INTRAMUSCULAR | Status: AC
  Filled 2022-04-01: qty 5

## 2022-04-01 MED ORDER — LACTATED RINGERS IV SOLN: INTRAVENOUS | Status: DC

## 2022-04-01 SURGICAL SUPPLY — 44 items
ADH SKN CLS APL DERMABOND .7 (GAUZE/BANDAGES/DRESSINGS) ×1
BAG COUNTER SPONGE SURGICOUNT (BAG) ×1 IMPLANT
BAG SPNG CNTER NS LX DISP (BAG) ×1
BALL CTTN LRG ABS STRL LF (GAUZE/BANDAGES/DRESSINGS) ×1
CANISTER SUCT 3000ML PPV (MISCELLANEOUS) ×1 IMPLANT
CLIP VESOCCLUDE SM WIDE 6/CT (CLIP) ×1 IMPLANT
CNTNR URN SCR LID CUP LEK RST (MISCELLANEOUS) ×1 IMPLANT
CONT SPEC 4OZ STRL OR WHT (MISCELLANEOUS) ×1
COTTONBALL LRG STERILE PKG (GAUZE/BANDAGES/DRESSINGS) ×1 IMPLANT
COVER SURGICAL LIGHT HANDLE (MISCELLANEOUS) ×1 IMPLANT
DERMABOND ADVANCED .7 DNX12 (GAUZE/BANDAGES/DRESSINGS) ×1 IMPLANT
DRAPE OPHTHALMIC 77X100 STRL (CUSTOM PROCEDURE TRAY) ×1 IMPLANT
ELECT NDL BLADE 2-5/6 (NEEDLE) ×1 IMPLANT
ELECT NEEDLE BLADE 2-5/6 (NEEDLE) ×1 IMPLANT
ELECT REM PT RETURN 9FT ADLT (ELECTROSURGICAL) ×1
ELECTRODE REM PT RTRN 9FT ADLT (ELECTROSURGICAL) ×1 IMPLANT
GAUZE 4X4 16PLY ~~LOC~~+RFID DBL (SPONGE) ×1 IMPLANT
GEL ULTRASOUND 8.5O AQUASONIC (MISCELLANEOUS) ×1 IMPLANT
GLOVE BIO SURGEON STRL SZ7.5 (GLOVE) ×1 IMPLANT
GLOVE BIOGEL PI IND STRL 8 (GLOVE) ×1 IMPLANT
GOWN STRL REUS W/ TWL LRG LVL3 (GOWN DISPOSABLE) ×1 IMPLANT
GOWN STRL REUS W/ TWL XL LVL3 (GOWN DISPOSABLE) ×1 IMPLANT
GOWN STRL REUS W/TWL LRG LVL3 (GOWN DISPOSABLE) ×1
GOWN STRL REUS W/TWL XL LVL3 (GOWN DISPOSABLE) ×1
KIT BASIN OR (CUSTOM PROCEDURE TRAY) ×1 IMPLANT
KIT TURNOVER KIT B (KITS) ×1 IMPLANT
LOOP VESSEL MINI RED (MISCELLANEOUS) ×1 IMPLANT
NDL HYPO 25GX1X1/2 BEV (NEEDLE) ×1 IMPLANT
NEEDLE HYPO 25GX1X1/2 BEV (NEEDLE) ×1 IMPLANT
NS IRRIG 1000ML POUR BTL (IV SOLUTION) ×1 IMPLANT
PACK GENERAL/GYN (CUSTOM PROCEDURE TRAY) ×1 IMPLANT
PAD ARMBOARD 7.5X6 YLW CONV (MISCELLANEOUS) ×2 IMPLANT
SPIKE FLUID TRANSFER (MISCELLANEOUS) ×1 IMPLANT
SUCTION FRAZIER HANDLE 10FR (MISCELLANEOUS) ×1
SUCTION TUBE FRAZIER 10FR DISP (MISCELLANEOUS) ×1 IMPLANT
SUT MNCRL AB 4-0 PS2 18 (SUTURE) ×1 IMPLANT
SUT PROLENE 6 0 BV (SUTURE) IMPLANT
SUT SILK 3 0 (SUTURE) ×1
SUT SILK 3-0 18XBRD TIE 12 (SUTURE) IMPLANT
SUT VIC AB 3-0 SH 27 (SUTURE) ×1
SUT VIC AB 3-0 SH 27X BRD (SUTURE) ×1 IMPLANT
SYR CONTROL 10ML LL (SYRINGE) ×1 IMPLANT
TOWEL GREEN STERILE (TOWEL DISPOSABLE) ×1 IMPLANT
WATER STERILE IRR 1000ML POUR (IV SOLUTION) ×1 IMPLANT

## 2022-04-01 NOTE — H&P (Signed)
History and Physical Interval Note:  04/01/2022 2:30 PM  Tonya Norton  has presented today for surgery, with the diagnosis of Rule out temporal arteritis.  The various methods of treatment have been discussed with the patient and family. After consideration of risks, benefits and other options for treatment, the patient has consented to  Procedure(s): RIGHT BIOPSY TEMPORAL ARTERY (Right) as a surgical intervention.  The patient's history has been reviewed, patient examined, no change in status, stable for surgery.  I have reviewed the patient's chart and labs.  Questions were answered to the patient's satisfaction.    Right temporal artery biopsy.    Marty Heck     Patient name: Tonya Norton           MRN: [id number]        DOB: 1955/06/22          Sex: female   REASON FOR CONSULT: Evaluate for right temporal artery biopsy   HPI: Tonya MALOOF is a 67 y.o. female,  with history hypertension, hyperlipidemia, mitral valve prolapse that presents for evaluation of right temporal artery biopsy.  Patient initially had right temporal tenderness with some associated headache that started in November 2023.  She was initially told this was related to shingles.  Her CRP and ESR were normal at that time.  Then several weeks ago she had recurrent episode with right temporal tenderness as well as a transient episode of right eye vision loss.  She was seen by Dr. Delman Cheadle her ophthalmologist who did not find anything abnormal with a normal eye exam.  Apparently after this visit she had repeat labs that showed an elevated sed rate of 27.  She is seeing rheumatology with Dr. Amil Amen who recommend a right temporal artery biopsy and referred her here to vascular surgery.  She has no left temporal tenderness only on the right side.       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, myxomatous mitral valve   Mitral regurgitation 12/05/2020   Mitral valve prolapse     Nocturia 07/23/2013   Raynaud's disease     Rosacea     Snoring 07/23/2013   Thyroid nodule             Past Surgical History:  Procedure Laterality Date   benign right breast biopsy       BREAST EXCISIONAL BIOPSY Right     DILATION AND CURETTAGE OF UTERUS       laser to cervix       SHOULDER SURGERY Right             Family History  Problem Relation Age of Onset   Prostate cancer Father 9        3/15   Glaucoma Father     COPD Father     Lung cancer Father     Dementia Father     Melanoma Father     Emphysema Mother 80        1/19   Neuropathy Mother     COPD Mother     Dementia Mother     Macular degeneration Mother  Glaucoma Paternal Grandfather     Macular degeneration Maternal Grandmother     Bladder Cancer Maternal Grandfather     Kidney cancer Maternal Grandfather     Breast cancer Neg Hx        SOCIAL HISTORY: Social History         Socioeconomic History   Marital status: Married      Spouse name: Tonya Norton   Number of children: 0   Years of education: [employer] education level: Not on file  Occupational History      Employer: [employer]  Tobacco Use   Smoking status: Never   Smokeless tobacco: Never  Vaping Use   Vaping Use: Never used  Substance and Sexual Activity   Alcohol use: Yes      Comment: socially 2-3 weekly   Drug use: No   Sexual activity: Not on file  Other Topics Concern   Not on file  Social History Narrative    Patient is married Tonya Norton) and lives at home with her husband.    Patient has two step daughters.    Patient is working full-time.    Patient has a Cytogeneticist school)    Patient is right-handed.    Patient drinks two cups of coffee or hot tea daily.    Social  Determinants of Health        Financial Resource Strain: Low Risk  (12/05/2020)    Overall Financial Resource Strain (CARDIA)     Difficulty of Paying Living Expenses: Not hard at all  Food Insecurity: No Food Insecurity (12/05/2020)    Hunger Vital Sign     Worried About Running Out of Food in the Last Year: Never true     Ran Out of Food in the Last Year: Never true  Transportation Needs: Unknown (12/05/2020)    PRAPARE - Armed forces logistics/support/administrative officer (Medical): No     Lack of Transportation (Non-Medical): Not on file  Physical Activity: Sufficiently Active (12/05/2020)    Exercise Vital Sign     Days of Exercise per Week: 5 days     Minutes of Exercise per Session: 40 min  Stress: Not on file  Social Connections: Not on file  Intimate Partner Violence: Not on file           Allergies  Allergen Reactions   Aspirin        Other reaction(s): may have caused some cough or wheeze but not totally sure in 2012 so could rechallenge if needed.            Current Outpatient Medications  Medication Sig Dispense Refill   Alpha-Lipoic Acid 200 MG CAPS Take 200 mg by mouth daily.       Ascorbic Acid (VITAMIN C) 500 MG CAPS Take 500 mg by mouth daily.       cholecalciferol (VITAMIN D3) 25 MCG (1000 UNIT) tablet Take 1,000 Units by mouth daily.       Coenzyme Q10 200 MG capsule Take 300 mg by mouth daily.       COVID-19 mRNA bivalent vaccine, Moderna, (MODERNA COVID-19 BIVAL BOOSTER) 50 MCG/0.5ML injection Inject into the muscle. 0.5 mL 0   COVID-19 mRNA bivalent vaccine, Moderna, (MODERNA COVID-19 BIVAL BOOSTER) 50 MCG/0.5ML injection Inject into the muscle. 0.5 mL 0   estradiol (ESTRACE) 0.1 MG/GM vaginal cream SMARTSIG:1 Applicator Vaginal Twice a Week       fexofenadine (ALLEGRA) 180 MG tablet Take 180  mg by mouth as needed for allergies or rhinitis.       FLUoxetine (PROZAC) 20 MG tablet Take 20 mg by mouth daily.        fluticasone (FLONASE) 50 MCG/ACT nasal spray Place 2  sprays into both nostrils daily.       guaiFENesin (MUCINEX) 600 MG 12 hr tablet Take 600 mg by mouth 2 (two) times daily as needed for cough.       Multiple Vitamins-Minerals (CENTRUM SILVER 50+WOMEN PO) Take 1 tablet by mouth daily.       rosuvastatin (CRESTOR) 10 MG tablet Take 10 mg by mouth daily.       RSV vaccine recomb adjuvanted (AREXVY) 120 MCG/0.5ML injection Inject into the muscle. 0.5 mL 0   SYNTHROID 100 MCG tablet Take 100 mcg by mouth See admin instructions. 6 days a week Monday-saturday       valsartan (DIOVAN) 80 MG tablet Take 80 mg by mouth daily.        No current facility-administered medications for this visit.      REVIEW OF SYSTEMS:  '[X]'$  denotes positive finding, '[ ]'$  denotes negative finding Cardiac   Comments:  Chest pain or chest pressure:      Shortness of breath upon exertion:      Short of breath when lying flat:      Irregular heart rhythm:             Vascular      Pain in calf, thigh, or hip brought on by ambulation:      Pain in feet at night that wakes you up from your sleep:       Blood clot in your veins:      Leg swelling:              Pulmonary      Oxygen at home:      Productive cough:       Wheezing:              Neurologic      Sudden weakness in arms or legs:       Sudden numbness in arms or legs:       Sudden onset of difficulty speaking or slurred speech:      Temporary loss of vision in one eye:       Problems with dizziness:              Gastrointestinal      Blood in stool:       Vomited blood:              Genitourinary      Burning when urinating:       Blood in urine:             Psychiatric      Major depression:              Hematologic      Bleeding problems:      Problems with blood clotting too easily:             Skin      Rashes or ulcers:             Constitutional      Fever or chills:          PHYSICAL EXAM:    Vitals:    03/26/22 1329  BP: (!) 140/69  Pulse: 71  Resp: 14  Temp: 97.8 F  (36.6 C)  TempSrc: Temporal  SpO2: 99%  Weight: 129 lb (58.5 kg)  Height: '5\' 7"'$  (1.702 m)      GENERAL: The patient is a well-nourished female, in no acute distress. The vital signs are documented above. CARDIAC: There is a regular rate and rhythm.  VASCULAR:  Mild right temporal tenderness with palpable temporal pulse No left temporal tenderness.  Palpable left temporal pulse PULMONARY: No respiratory distress. ABDOMEN: Soft and non-tender. MUSCULOSKELETAL: There are no major deformities or cyanosis. NEUROLOGIC: No focal weakness or paresthesias are detected. PSYCHIATRIC: The patient has a normal affect.   DATA:    N/A   Assessment/Plan:    67 y.o. female,  with history hypertension, hyperlipidemia, mitral valve prolapse that presents for evaluation of right temporal artery biopsy.  I reviewed her records and I think it would be reasonable to proceed with right temporal artery biopsy.  She had right temporal tenderness with some headaches that started back in November 2023 when she was told this was related to shingles.  This ultimately got better and then she had a second episode that started several weeks ago and she has been seen by rheumatology as well as ophthalmology.  Rheumatology is now recommending right temporal artery biopsy.  I discussed basic steps of the operation as well as risk benefits.  Discussed this is the gold standard to diagnose temporal arteritis.  She wishes to proceed.  Will get her scheduled for early next week.     Marty Heck, MD [facility] Office: [phone]

## 2022-04-01 NOTE — Anesthesia Preprocedure Evaluation (Signed)
Anesthesia Evaluation  Patient identified by MRN, date of birth, ID band Patient awake    Reviewed: Allergy & Precautions, H&P , NPO status , Patient's Chart, lab work & pertinent test results  Airway Mallampati: II  TM Distance: >3 FB Neck ROM: Full    Dental no notable dental hx.    Pulmonary neg pulmonary ROS   Pulmonary exam normal breath sounds clear to auscultation       Cardiovascular hypertension, + Peripheral Vascular Disease  Normal cardiovascular exam+ Valvular Problems/Murmurs MR  Rhythm:Regular Rate:Normal  Left Ventricle: Left ventricular ejection fraction, by estimation, is 60  to 65%. The left ventricle has normal function. The left ventricle has no  regional wall motion abnormalities. The average left ventricular global  longitudinal strain is -22.6 %.  The global longitudinal strain is normal. The left ventricular internal  cavity size was normal in size. There is no left ventricular hypertrophy.  Left ventricular diastolic parameters are consistent with Grade I  diastolic dysfunction (impaired  relaxation). Indeterminate filling pressures.   Right Ventricle: The right ventricular size is normal. No increase in  right ventricular wall thickness. Right ventricular systolic function is  normal.   Left Atrium: Left atrial size was normal in size.   Right Atrium: Right atrial size was normal in size.   Pericardium: There is no evidence of pericardial effusion.   Mitral Valve: The mitral valve is abnormal. There is mild late systolic  prolapse of the middle scallop of the posterior leaflet of the mitral  valve. There is mild thickening of the mitral valve leaflet(s). Mild  mitral valve regurgitation.   Tricuspid Valve: The tricuspid valve is grossly normal. Tricuspid valve  regurgitation is trivial.   Aortic Valve: The aortic valve is tricuspid. Aortic valve regurgitation is  not visualized.   Pulmonic  Valve: The pulmonic valve was normal in structure. Pulmonic valve  regurgitation is trivial.   Aorta: The aortic root and ascending aorta are structurally normal, with  no evidence of dilitation.     Neuro/Psych negative neurological ROS  negative psych ROS   GI/Hepatic negative GI ROS, Neg liver ROS,,,  Endo/Other  Hypothyroidism    Renal/GU negative Renal ROS  negative genitourinary   Musculoskeletal negative musculoskeletal ROS (+)    Abdominal   Peds negative pediatric ROS (+)  Hematology negative hematology ROS (+)   Anesthesia Other Findings   Reproductive/Obstetrics negative OB ROS                             Anesthesia Physical Anesthesia Plan  ASA: 2  Anesthesia Plan: General   Post-op Pain Management: Minimal or no pain anticipated   Induction: Intravenous  PONV Risk Score and Plan: 2 and Ondansetron, Dexamethasone and Treatment may vary due to age or medical condition  Airway Management Planned: LMA  Additional Equipment:   Intra-op Plan:   Post-operative Plan: Extubation in OR  Informed Consent: I have reviewed the patients History and Physical, chart, labs and discussed the procedure including the risks, benefits and alternatives for the proposed anesthesia with the patient or authorized representative who has indicated his/her understanding and acceptance.     Dental advisory given  Plan Discussed with: CRNA and Surgeon  Anesthesia Plan Comments:        Anesthesia Quick Evaluation

## 2022-04-01 NOTE — Transfer of Care (Signed)
Immediate Anesthesia Transfer of Care Note  Patient: Tonya Norton  Procedure(s) Performed: RIGHT BIOPSY TEMPORAL ARTERY (Right)  Patient Location: PACU  Anesthesia Type:MAC  Level of Consciousness: awake, alert , and oriented  Airway & Oxygen Therapy: Patient Spontanous Breathing  Post-op Assessment: Report given to RN and Post -op Vital signs reviewed and stable  Post vital signs: Reviewed and stable  Last Vitals:  Vitals Value Taken Time  BP    Temp    Pulse 72 04/01/22 1558  Resp    SpO2 99 % 04/01/22 1558  Vitals shown include unvalidated device data.  Last Pain:  Vitals:   04/01/22 1307  TempSrc:   PainSc: 0-No pain         Complications: There were no known notable events for this encounter.

## 2022-04-01 NOTE — Op Note (Signed)
Date: April 01, 2022  Preoperative diagnosis: Concern for temporal arteritis  Postoperative diagnosis: Same  Procedure: Right temporal artery biopsy  Surgeon: Dr. Marty Heck, MD  Assistant: OR staff  Indications: 67 year old female seen for evaluation of right temporal artery biopsy.  She initially had some right temporal headaches with some associated right temporal tenderness that started in November 2023.  She also had an episode of transient right eye vision loss.  She presents today for temporal artery biopsy after risks benefits discussed.  Findings: Approximate 3 cm sample of the right temporal artery was biopsied and sent to pathology.  Anesthesia: MAC with local  Details: Patient was taken to the operating room after informed consent was obtained.  Placed on the operative table in the supine position.  I used a pencil Doppler and marked out her temporal artery where I could feel a prominent pulse.  The right temporal region was then prepped and draped in standard sterile fashion.  Antibiotics were given.  Timeout performed.  I used 5 mL of 1% lidocaine without epinephrine to localize the area.  I made a incision here with a 15 blade scalpel and then dissected down with Bovie cautery.  I used a spring retractors and then used Metzenbaum scissors to dissect out approximately 3 cm section of temporal artery.  All side branches were ligated with clips.  Once I had a long enough sample of the specimen dissected out it was then ligated over right angle clamps and passed off the field and sent to pathology.  Each end of the temporal artery was then ligated over the right angle clamp with 3-0 silks.  We irrigated the wound and this was closed with 3-0 Vicryl 4-0 Monocryl and Dermabond.  Complication: None  Condition: Stable  Marty Heck, MD [facility] Office: [place]

## 2022-04-01 NOTE — Anesthesia Postprocedure Evaluation (Signed)
Anesthesia Post Note  Patient: CEARA VENNE  Procedure(s) Performed: RIGHT BIOPSY TEMPORAL ARTERY (Right)     Patient location during evaluation: PACU Anesthesia Type: MAC Level of consciousness: awake and alert Pain management: pain level controlled Vital Signs Assessment: post-procedure vital signs reviewed and stable Respiratory status: spontaneous breathing, nonlabored ventilation and respiratory function stable Cardiovascular status: stable and blood pressure returned to baseline Anesthetic complications: no   There were no known notable events for this encounter.  Last Vitals:  Vitals:   04/01/22 1615 04/01/22 1630  BP: 129/67 (!) 145/63  Pulse: 60 (!) 58  Resp: 13 15  Temp:  36.7 C  SpO2: 100% 100%    Last Pain:  Vitals:   04/01/22 1615  TempSrc:   PainSc: 0-No pain                 Audry Pili

## 2022-04-02 ENCOUNTER — Encounter (HOSPITAL_COMMUNITY): Payer: Self-pay | Admitting: Vascular Surgery

## 2022-04-02 LAB — POCT I-STAT, CHEM 8
BUN: 25 mg/dL — ABNORMAL HIGH (ref 8–23)
Calcium, Ion: 0.96 mmol/L — ABNORMAL LOW (ref 1.15–1.40)
Chloride: 111 mmol/L (ref 98–111)
Creatinine, Ser: 0.6 mg/dL (ref 0.44–1.00)
Glucose, Bld: 83 mg/dL (ref 70–99)
HCT: 45 % (ref 36.0–46.0)
Hemoglobin: 15.3 g/dL — ABNORMAL HIGH (ref 12.0–15.0)
Potassium: 5.1 mmol/L (ref 3.5–5.1)
Sodium: 137 mmol/L (ref 135–145)
TCO2: 20 mmol/L — ABNORMAL LOW (ref 22–32)

## 2022-04-03 LAB — SURGICAL PATHOLOGY

## 2022-04-04 ENCOUNTER — Telehealth: Payer: Self-pay | Admitting: Vascular Surgery

## 2022-04-04 NOTE — Telephone Encounter (Signed)
Called and informed Mrs Comte that her temporal artery biopsy results were negative for arteritis.  Marty Heck, MD [facility] Office: [place]

## 2022-04-09 ENCOUNTER — Ambulatory Visit
Admission: RE | Admit: 2022-04-09 | Discharge: 2022-04-09 | Disposition: A | Discharge status: Home / Self Care | Payer: Medicare Other | Source: Ambulatory Visit | Attending: Obstetrics | Admitting: Obstetrics

## 2022-04-09 DIAGNOSIS — Z1231 Encounter for screening mammogram for malignant neoplasm of breast: Secondary | ICD-10-CM

## 2022-05-14 ENCOUNTER — Telehealth: Payer: Self-pay | Admitting: Cardiovascular Disease

## 2022-05-14 ENCOUNTER — Other Ambulatory Visit: Payer: Self-pay | Admitting: Internal Medicine

## 2022-05-14 DIAGNOSIS — R519 Headache, unspecified: Secondary | ICD-10-CM

## 2022-05-14 NOTE — Telephone Encounter (Signed)
Returned call to [facility],   Patient was in office this morning and they are requesting a carotid and Echo (not TEE as incorrectly documented in original phone call note) - 5 episodes of loss of vision. Requested office notes from visit today for Dr. Duke Salvia to review.   Routing to Dr. Duke Salvia and Regis Bill, LPN for review. We will watch for records to come through fax.

## 2022-05-14 NOTE — Telephone Encounter (Signed)
Morrie Sheldon from [facility] called in asking to speak to clinical staff about ordering pt a Carotid doppler and transthoracic Echocardiogram. Please advise   Best c/b - [phone] (she states it is best if you not lvm)

## 2022-05-16 ENCOUNTER — Encounter (HOSPITAL_BASED_OUTPATIENT_CLINIC_OR_DEPARTMENT_OTHER): Payer: Self-pay

## 2022-05-16 NOTE — Telephone Encounter (Signed)
Left message for patient to call and schedule the Echocardiogram and Carotid doppler ordered by Dr. Sinda Du

## 2022-05-17 NOTE — Telephone Encounter (Signed)
Patient has been scheduled for Echo and Carotid

## 2022-05-20 ENCOUNTER — Other Ambulatory Visit (HOSPITAL_COMMUNITY): Payer: Self-pay | Admitting: Ophthalmology

## 2022-05-20 DIAGNOSIS — H539 Unspecified visual disturbance: Secondary | ICD-10-CM

## 2022-05-21 ENCOUNTER — Ambulatory Visit (HOSPITAL_COMMUNITY)
Admission: RE | Admit: 2022-05-21 | Discharge: 2022-05-21 | Disposition: A | Discharge status: Home / Self Care | Payer: Medicare Other | Source: Ambulatory Visit | Attending: Cardiology | Admitting: Cardiology

## 2022-05-21 DIAGNOSIS — H539 Unspecified visual disturbance: Secondary | ICD-10-CM

## 2022-05-21 DIAGNOSIS — H53121 Transient visual loss, right eye: Secondary | ICD-10-CM

## 2022-05-24 ENCOUNTER — Other Ambulatory Visit (HOSPITAL_BASED_OUTPATIENT_CLINIC_OR_DEPARTMENT_OTHER): Payer: Self-pay | Admitting: Ophthalmology

## 2022-05-24 DIAGNOSIS — G459 Transient cerebral ischemic attack, unspecified: Secondary | ICD-10-CM

## 2022-05-27 ENCOUNTER — Encounter (HOSPITAL_BASED_OUTPATIENT_CLINIC_OR_DEPARTMENT_OTHER): Payer: Self-pay | Admitting: Cardiovascular Disease

## 2022-05-27 DIAGNOSIS — R9439 Abnormal result of other cardiovascular function study: Secondary | ICD-10-CM

## 2022-05-30 NOTE — Telephone Encounter (Signed)
Dr Duke Salvia reviewed, was sent directly to Dr Cathey Endow ordering physician. Referral placed for [facility], Dr Cristal Deer per patient request Discussed with patient

## 2022-05-31 ENCOUNTER — Other Ambulatory Visit (HOSPITAL_BASED_OUTPATIENT_CLINIC_OR_DEPARTMENT_OTHER): Payer: Self-pay

## 2022-05-31 ENCOUNTER — Ambulatory Visit (INDEPENDENT_AMBULATORY_CARE_PROVIDER_SITE_OTHER): Payer: Medicare Other

## 2022-05-31 DIAGNOSIS — G459 Transient cerebral ischemic attack, unspecified: Secondary | ICD-10-CM

## 2022-05-31 LAB — ECHOCARDIOGRAM COMPLETE
AR max vel: 2.38 cm2
AV Area VTI: 2.44 cm2
AV Area mean vel: 2.2 cm2
AV Mean grad: 3 mmHg
AV Peak grad: 5.3 mmHg
Ao pk vel: 1.15 m/s
Area-P 1/2: 3.03 cm2
Calc EF: 66.6 %
MV M vel: 4.64 m/s
MV Peak grad: 86.1 mmHg
Radius: 0.5 cm
S' Lateral: 2.34 cm
Single Plane A2C EF: 67.7 %
Single Plane A4C EF: 63.9 %

## 2022-05-31 MED ORDER — COMIRNATY 30 MCG/0.3ML IM SUSY
0.3000 mL | PREFILLED_SYRINGE | Freq: Once | INTRAMUSCULAR | 0 refills | Status: AC
  Filled 2022-05-31: qty 0.3, 1d supply, fill #0

## 2022-06-05 ENCOUNTER — Ambulatory Visit
Admission: RE | Admit: 2022-06-05 | Discharge: 2022-06-05 | Disposition: A | Discharge status: Home / Self Care | Payer: Medicare Other | Source: Ambulatory Visit | Attending: Internal Medicine | Admitting: Internal Medicine

## 2022-06-05 DIAGNOSIS — R519 Headache, unspecified: Secondary | ICD-10-CM

## 2022-06-05 MED ORDER — GADOPICLENOL 0.5 MMOL/ML IV SOLN
6.0000 mL | Freq: Once | INTRAVENOUS | Status: AC | PRN
  Administered 2022-06-05: 6 mL via INTRAVENOUS

## 2022-06-07 ENCOUNTER — Telehealth (HOSPITAL_BASED_OUTPATIENT_CLINIC_OR_DEPARTMENT_OTHER): Payer: Self-pay | Admitting: Cardiovascular Disease

## 2022-06-07 NOTE — Telephone Encounter (Signed)
----   Message ----- From: Leotis Shames, RVS, RCS Sent: 05/31/2022   8:26 AM EDT To: Chilton Si, MD Subject: referral for Dimple Nanas  1955-06-08           Good morning.   I was reviewing the referral to Dr. Chestine Spore for the abnormal carotid Duplex exam that was performed.   I found an error in the report. There was no stenosis of the left common carotid artery when I reviewed the actual images of the exam. It was a normal exam.   I've contacted the performing tech to make the correction to the report.   Are you agreeable to close this referral?   Thanks

## 2022-06-20 NOTE — Telephone Encounter (Signed)
Discussed with Dr Duke Salvia and ok to remove referral   Advised patient, verbalized understanding

## 2022-06-22 IMAGING — MG MM DIGITAL SCREENING BILAT W/ TOMO AND CAD
8 series · 9 of 24 positions shown · non-contrast
Comparison: Previous exam(s).

CLINICAL DATA: Screening.

EXAM:
DIGITAL SCREENING BILATERAL MAMMOGRAM WITH TOMOSYNTHESIS AND CAD
TECHNIQUE: Bilateral screening digital craniocaudal and mediolateral oblique
mammograms were obtained. Bilateral screening digital breast
tomosynthesis was performed. The images were evaluated with
computer-aided detection.

[L CC synth-2D]
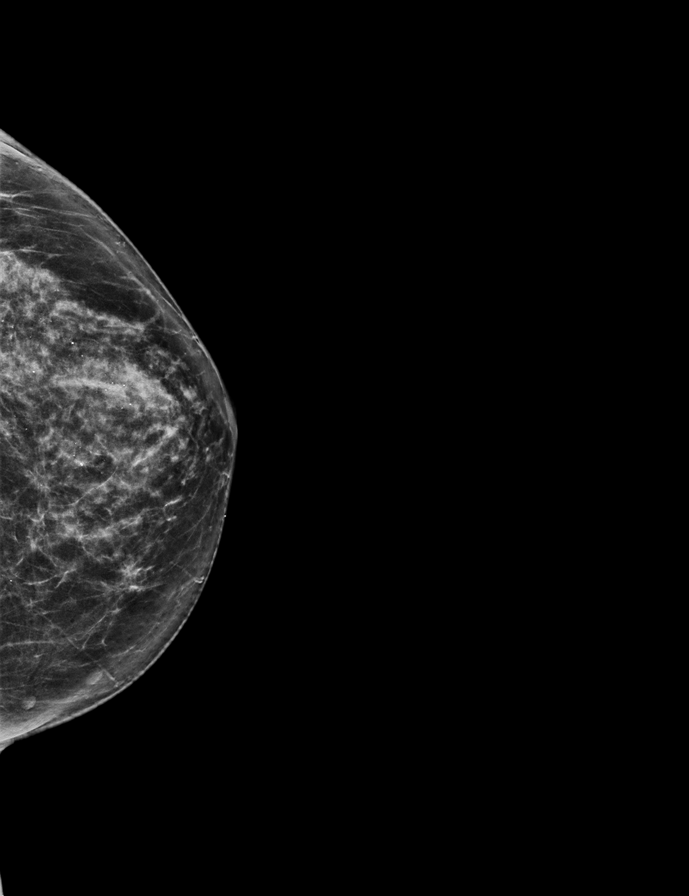

[R MLO synth-2D]
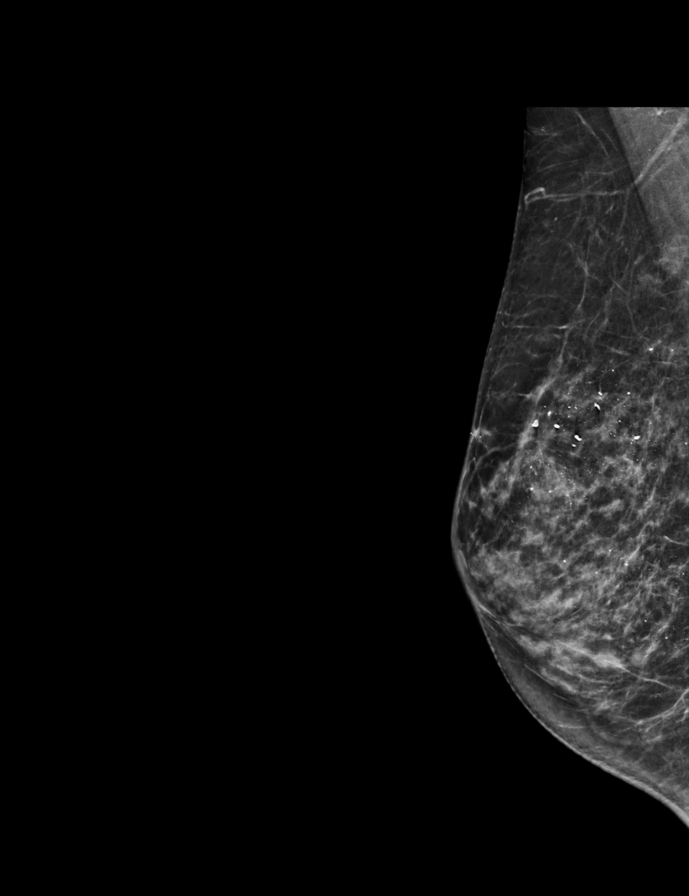

[L MLO synth-2D]
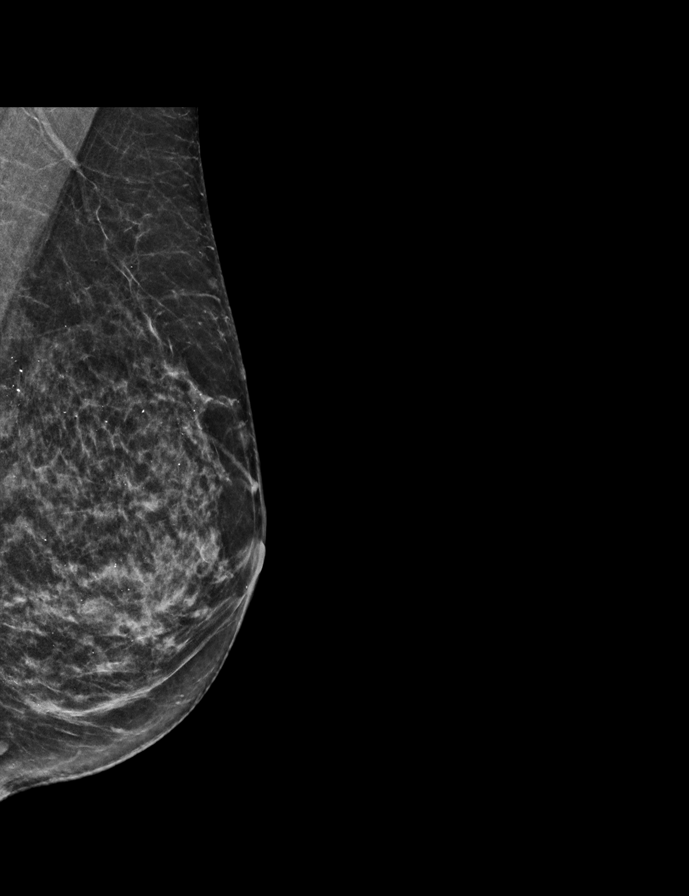

[R CC synth-2D]
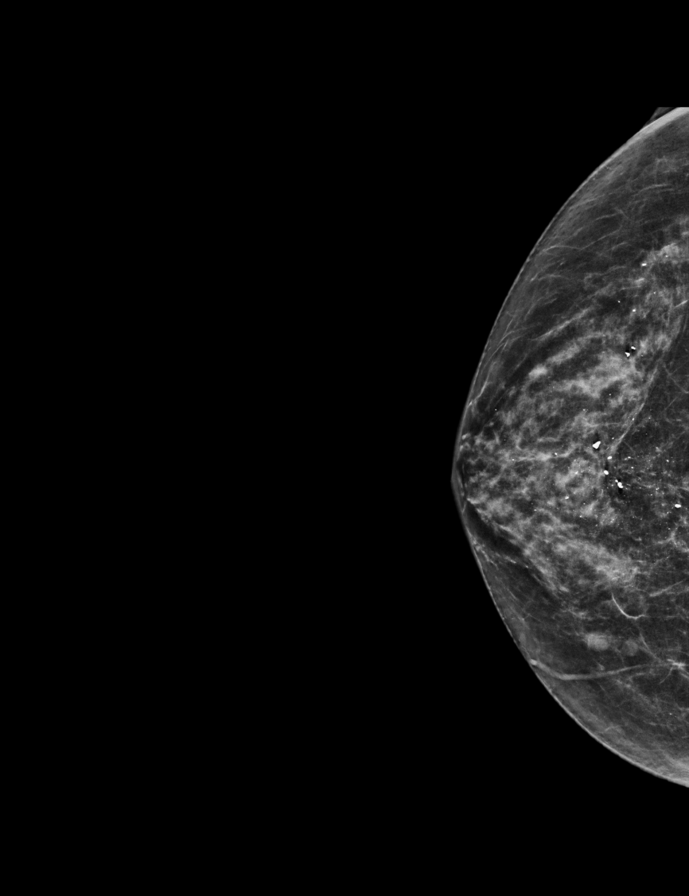

[L CC tomo · 2 of 64 frames shown]
[frame 21/64]
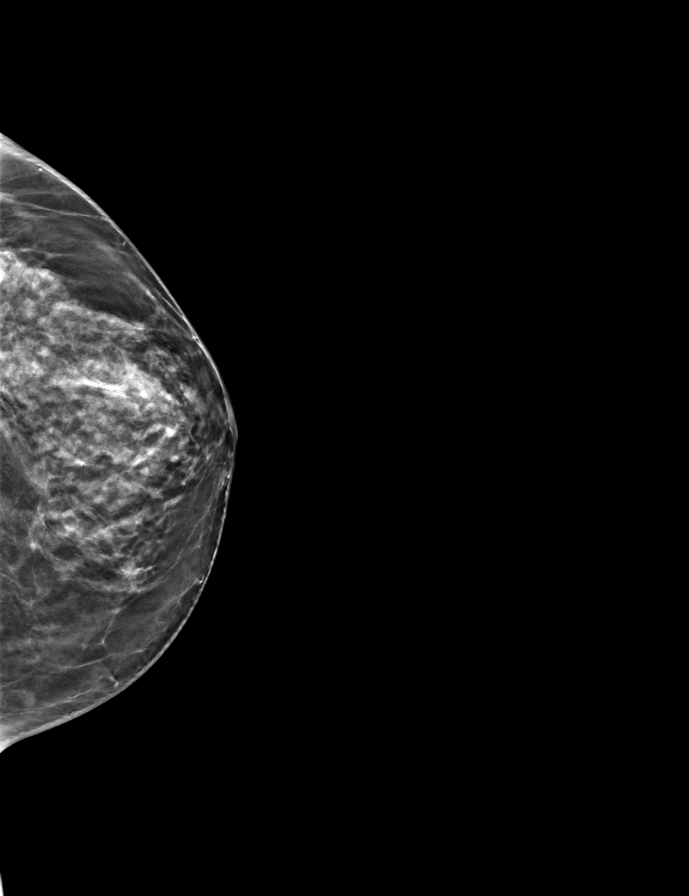
[frame 33/64]
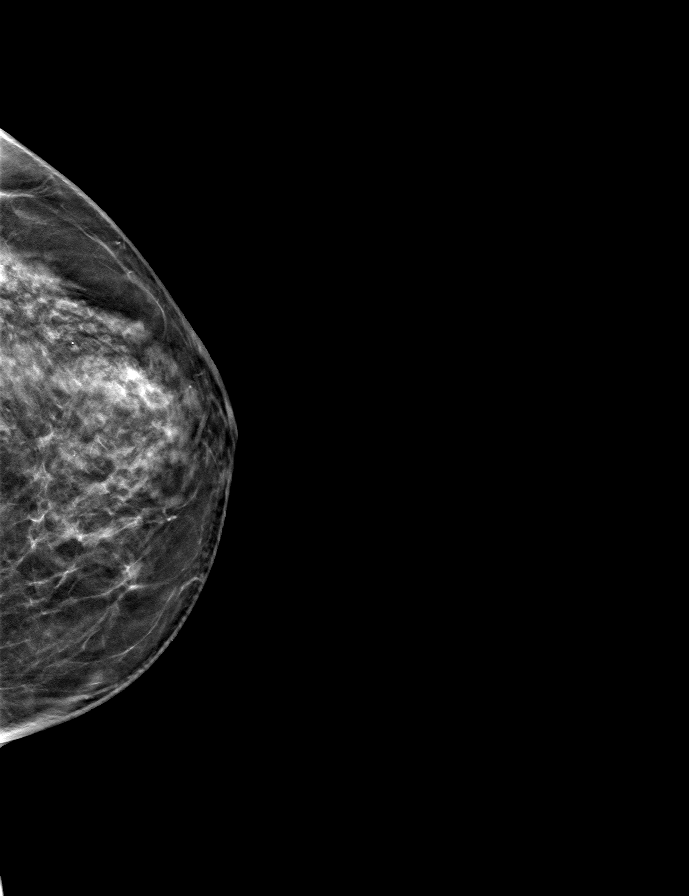

[L MLO tomo · tomo slice 27/52.0]
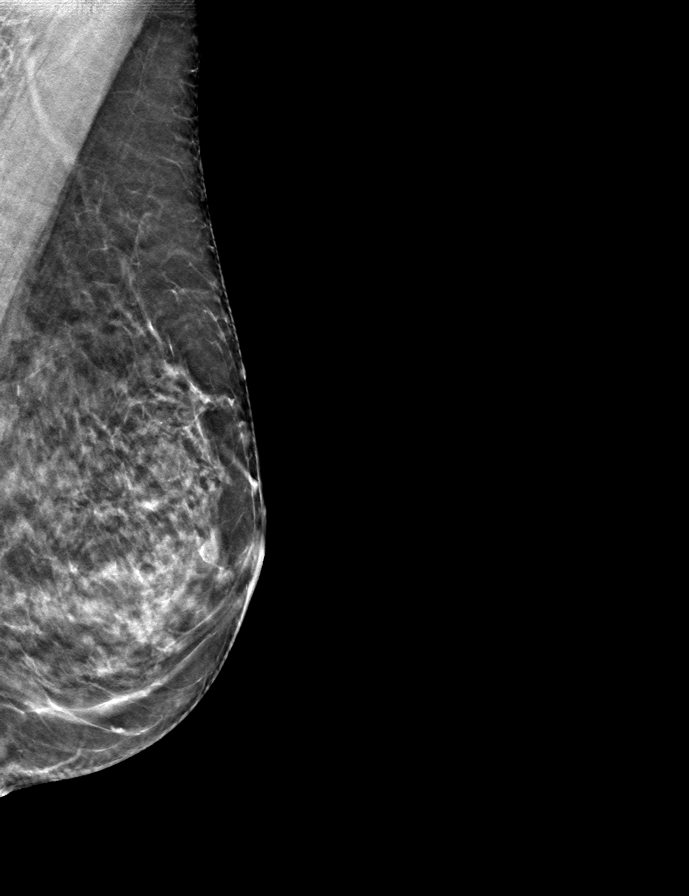

[R CC tomo · tomo slice 29/58.0]
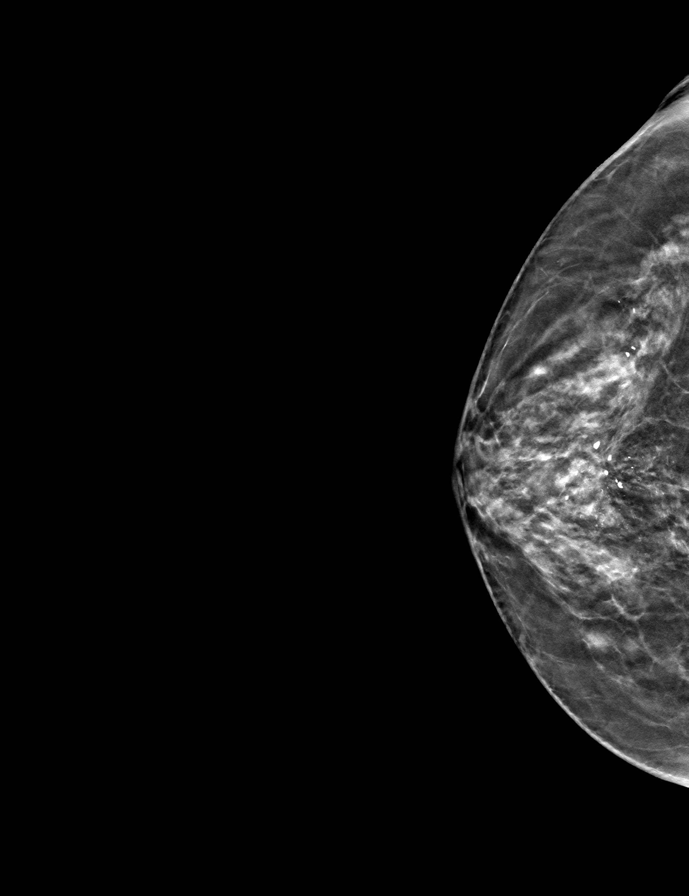

[R MLO tomo · tomo slice 29/56.0]
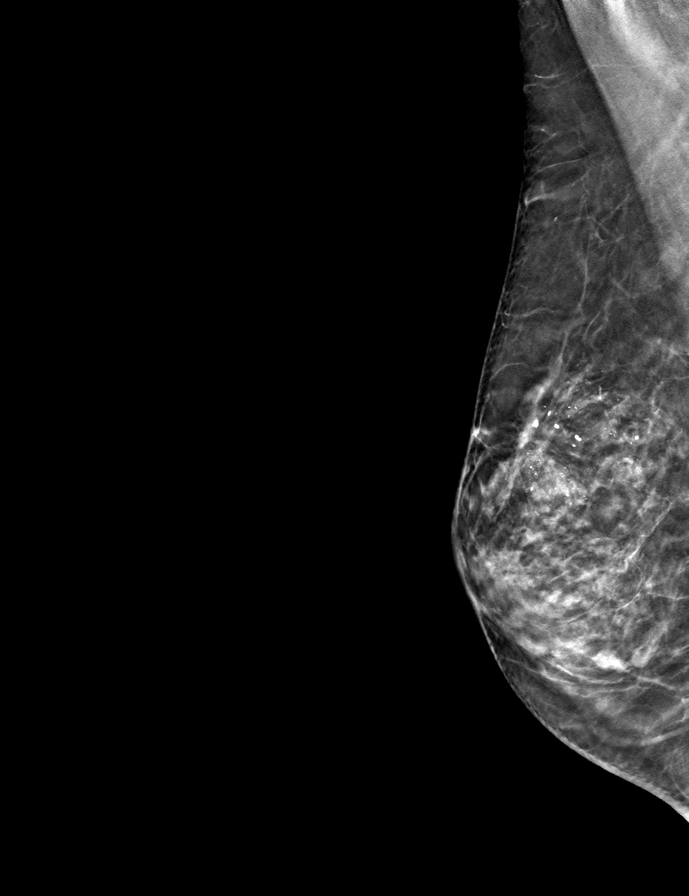

[9 of 24 positions shown; findings below may reference images not displayed]

ACR Breast Density Category d: The breast tissue is extremely dense,
which lowers the sensitivity of mammography
FINDINGS: There are no findings suspicious for malignancy.
IMPRESSION: No mammographic evidence of malignancy. A result letter of this
screening mammogram will be mailed directly to the patient.

RECOMMENDATION:
Screening mammogram in one year. (Code:TA-V-WV9)

BI-RADS CATEGORY  1: Negative.

## 2022-10-18 ENCOUNTER — Other Ambulatory Visit (HOSPITAL_BASED_OUTPATIENT_CLINIC_OR_DEPARTMENT_OTHER): Payer: Self-pay

## 2022-10-24 ENCOUNTER — Other Ambulatory Visit (HOSPITAL_BASED_OUTPATIENT_CLINIC_OR_DEPARTMENT_OTHER): Payer: Self-pay

## 2022-10-24 MED ORDER — COVID-19 MRNA VAC-TRIS(PFIZER) 30 MCG/0.3ML IM SUSY
0.3000 mL | PREFILLED_SYRINGE | Freq: Once | INTRAMUSCULAR | 0 refills | Status: AC
  Filled 2022-10-24: qty 0.3, 1d supply, fill #0

## 2022-12-17 ENCOUNTER — Other Ambulatory Visit: Payer: Self-pay | Admitting: Endocrinology

## 2022-12-17 DIAGNOSIS — E049 Nontoxic goiter, unspecified: Secondary | ICD-10-CM

## 2022-12-26 ENCOUNTER — Ambulatory Visit
Admission: RE | Admit: 2022-12-26 | Discharge: 2022-12-26 | Disposition: A | Discharge status: Home / Self Care | Payer: Medicare Other | Source: Ambulatory Visit | Attending: Endocrinology | Admitting: Endocrinology

## 2022-12-26 DIAGNOSIS — E049 Nontoxic goiter, unspecified: Secondary | ICD-10-CM

## 2023-01-07 ENCOUNTER — Other Ambulatory Visit: Payer: Self-pay | Admitting: Endocrinology

## 2023-01-07 DIAGNOSIS — E049 Nontoxic goiter, unspecified: Secondary | ICD-10-CM

## 2023-01-20 ENCOUNTER — Other Ambulatory Visit (HOSPITAL_COMMUNITY)
Admission: RE | Admit: 2023-01-20 | Discharge: 2023-01-20 | Disposition: A | Discharge status: Home / Self Care | Payer: Medicare Other | Source: Ambulatory Visit | Attending: Interventional Radiology | Admitting: Interventional Radiology

## 2023-01-20 ENCOUNTER — Ambulatory Visit
Admission: RE | Admit: 2023-01-20 | Discharge: 2023-01-20 | Disposition: A | Discharge status: Home / Self Care | Payer: Medicare Other | Source: Ambulatory Visit | Attending: Endocrinology | Admitting: Endocrinology

## 2023-01-20 DIAGNOSIS — E049 Nontoxic goiter, unspecified: Secondary | ICD-10-CM

## 2023-01-21 LAB — CYTOLOGY - NON PAP

## 2023-02-06 ENCOUNTER — Other Ambulatory Visit: Payer: Medicare Other

## 2023-03-04 ENCOUNTER — Other Ambulatory Visit: Payer: Self-pay | Admitting: Obstetrics

## 2023-03-04 DIAGNOSIS — Z1231 Encounter for screening mammogram for malignant neoplasm of breast: Secondary | ICD-10-CM

## 2023-03-10 LAB — LAB REPORT - SCANNED
EGFR (Non-African Amer.): 83.2
TSH: 0.95 (ref 0.41–5.90)

## 2023-04-01 ENCOUNTER — Encounter (HOSPITAL_BASED_OUTPATIENT_CLINIC_OR_DEPARTMENT_OTHER): Payer: Self-pay | Admitting: Cardiovascular Disease

## 2023-04-01 ENCOUNTER — Ambulatory Visit (INDEPENDENT_AMBULATORY_CARE_PROVIDER_SITE_OTHER): Payer: Medicare Other | Admitting: Cardiovascular Disease

## 2023-04-01 VITALS — BP 130/64 | HR 74 | Ht 67.0 in | Wt 127.0 lb

## 2023-04-01 DIAGNOSIS — I10 Essential (primary) hypertension: Secondary | ICD-10-CM

## 2023-04-01 NOTE — Progress Notes (Signed)
 Cardiology Office Note:  .   Date:  04/01/2023  ID:  Tonya Norton, DOB 06-Nov-1955, MRN [id number] PCP: Tonya Ran, MD  [facility] Providers Cardiologist:  None    History of Present Illness: .    Tonya Norton is a 68 y.o. female with hypertension, hyperlipidemia, and mitral valve prolapse who is being seen today for follow-up. She was initially seen 06/08/2020 for the evaluation of transient global amnesia at the request of Tonya Ran, MD.  She was seen in the hospital 06/2020.  Her husband had a syncopal episode that morning.  She called 911 and followed the ambulance in her car.  Upon arrival she was confused and disoriented.  Her husband ultimately needed a pacemaker implanted.  She was evaluated and had no neurologic deficits on imaging.  She had an echo 04/2019 that revealed LVEF 60 to 65% with grade 2 diastolic dysfunction.  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  The echo was otherwise unremarkable.  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  They could not completely rule out TIA so they recommended one month of clopidogrel.  She filled the prescription but decided not to take it.  Of note, her MR-A incidentally noted a short segment of moderate to severe left distal subclavian artery stenosis.  She has not noted any left arm numbness, tingling, pain, or weakness.  She has been checking her blood pressures in both arms and the left arm is usually about 6-8 points lower than the right.  Dr. Waynard Norton arranged for her to see a vascular specialist.   Today, she is overall doing well. She had a previous mild breakout of shingles by her eye, despite having the vaccine. In addition, around September she had a GERD exacerbation but is doing much better now. She has been   At her visit 02/2022 she was doing well.  Echo 05/2022 revealed LVEF 60-65% with normal diastolic function and mild MVP.  There was  mild-moderate mitral regrugitation.   Lately Tonya Norton has been more diligent about monitoring her blood pressure at home. She experiences some elevated readings, such as 153/75 mmHg, but also notes variability with readings as low as 124/69 mmHg. She may have 'white coat syndrome' when checking her blood pressure. No low blood pressure readings have caused lightheadedness.  Her protein to albumin ratio was found to be abnormal during her annual check-up on March 17, 2023. Her previous doctor mentioned that this could indicate an increased risk of kidney or cardiac issues, and recommended a recheck in two months.  She has a history of hypertension, for which she is currently taking Valsartan 80 mg daily. She recalls undergoing a 24-hour ambulatory blood pressure monitoring in the past, which led to the initiation of Valsartan. She also has a history of grade two diastolic dysfunction, which has since improved to grade one. Her cholesterol levels are well-managed with rosuvastatin.  In terms of lifestyle, she has been more sedentary over the winter, which she believes may have contributed to her blood pressure issues. She plans to increase her physical activity as the weather improves.  No lightheadedness, swelling, or breathing difficulties.     ROS:  As per HPI  Studies Reviewed: Marland Kitchen   EKG Interpretation Date/Time:  Tuesday April 01 2023 09:17:12 EST Ventricular Rate:  64 PR Interval:  160 QRS Duration:  84 QT Interval:  414 QTC Calculation: 427 R Axis:  82  Text Interpretation: Normal sinus rhythm Normal ECG No significant change since last tracing Confirmed by Tonya Norton ([id number]) on 04/01/2023 9:24:55 AM   Echo 05/2022:  IMPRESSIONS   1. Left ventricular ejection fraction, by estimation, is 60 to 65%. The  left ventricle has normal function. The left ventricle has no regional  wall motion abnormalities. Left ventricular diastolic parameters were  normal. The average left  ventricular  global longitudinal strain is -18.3 %. The global longitudinal strain is  normal.   2. Right ventricular systolic function is normal. The right ventricular  size is normal.   3. Mild prolapse of the posterior leaflet . The mitral valve is abnormal.  Mild to moderate mitral valve regurgitation. No evidence of mitral  stenosis.   4. The aortic valve is tricuspid. There is mild calcification of the  aortic valve. There is mild thickening of the aortic valve. Aortic valve  regurgitation is not visualized. Aortic valve sclerosis is present, with  no evidence of aortic valve stenosis.   5. The inferior vena cava is normal in size with greater than 50%  respiratory variability, suggesting right atrial pressure of 3 mmHg.   Risk Assessment/Calculations:             Physical Exam:   VS:  BP 130/64   Pulse 74   Ht 5\' 7"  (1.702 m)   Wt 127 lb (57.6 kg)   SpO2 94%   BMI 19.89 kg/m  , BMI Body mass index is 19.89 kg/m. GENERAL:  Well appearing HEENT: Pupils equal round and reactive, fundi not visualized, oral mucosa unremarkable NECK:  No jugular venous distention, waveform within normal limits, carotid upstroke brisk and symmetric, no bruits, no thyromegaly LUNGS:  Clear to auscultation bilaterally HEART:  RRR.  PMI not displaced or sustained,S1 and S2 within normal limits, no S3, no S4, no clicks, no rubs, no murmurs ABD:  Flat, positive bowel sounds normal in frequency in pitch, no bruits, no rebound, no guarding, no midline pulsatile mass, no hepatomegaly, no splenomegaly EXT:  2 plus pulses throughout, no edema, no cyanosis no clubbing SKIN:  No rashes no nodules NEURO:  Cranial nerves II through XII grossly intact, motor grossly intact throughout PSYCH:  Cognitively intact, oriented to person place and time  ASSESSMENT AND PLAN: .    # Hypertension Recent home blood pressure readings have been elevated, with some readings over 130/80. Patient has been more sedentary  over the winter and has been inconsistent with home blood pressure monitoring. Currently on Valsartan 80mg . -Order 24-hour blood pressure monitor to assess true blood pressure control. -Encourage increased physical activity. -Check blood pressure at home regularly.   # Mitral valve prolapse:  Mild-moderate.  She has no heart failure symptoms.   # Albumin/Creatinine Ratio Recent lab results showed an abnormal albumin/creatinine ratio, indicating potential kidney or cardiac issues. Patient's primary care physician plans to recheck in two months. -Continue Valsartan 80mg  daily, which is protective for the kidneys. -Encourage patient to follow up with primary care physician for recheck of albumin/creatinine ratio.  # Hyperlipidemia Cholesterol levels well controlled on Rosuvastatin. -Continue Rosuvastatin.  Follow-up No acute issues at this time. -Schedule follow-up appointment in 1 year, unless changes are needed based on 24-hour blood pressure monitor results.       Signed, Tonya Si, MD

## 2023-04-01 NOTE — Patient Instructions (Addendum)
 Medication Instructions:  Your physician recommends that you continue on your current medications as directed. Please refer to the Current Medication list given to you today.   *If you need a refill on your cardiac medications before your next appointment, please call your pharmacy*  Lab Work: NONE  Testing/Procedures: NONE   Follow-Up: At [facility], you and your health needs are our priority.  As part of our continuing mission to provide you with exceptional heart care, we have created designated Provider Care Teams.  These Care Teams include your primary Cardiologist (physician) and Advanced Practice Providers (APPs -  Physician Assistants and Nurse Practitioners) who all work together to provide you with the care you need, when you need it.  We recommend signing up for the patient portal called "MyChart".  Sign up information is provided on this After Visit Summary.  MyChart is used to connect with patients for Virtual Visits (Telemedicine).  Patients are able to view lab/test results, encounter notes, upcoming appointments, etc.  Non-urgent messages can be sent to your provider as well.   To learn more about what you can do with MyChart, go to [email or web address].    Your next appointment:   12 month(s)  Provider:   Chilton Si, MD    MONITOR YOUR BLOOD PRESSURE AT HOME. CALL OR MYCHART THE OFFICE WITH CONCERNING READINGS

## 2023-04-02 ENCOUNTER — Encounter (HOSPITAL_BASED_OUTPATIENT_CLINIC_OR_DEPARTMENT_OTHER): Payer: Self-pay | Admitting: Cardiovascular Disease

## 2023-04-02 DIAGNOSIS — I10 Essential (primary) hypertension: Secondary | ICD-10-CM

## 2023-04-14 ENCOUNTER — Ambulatory Visit
Admission: RE | Admit: 2023-04-14 | Discharge: 2023-04-14 | Disposition: A | Discharge status: Home / Self Care | Payer: Medicare Other | Source: Ambulatory Visit | Attending: Obstetrics | Admitting: Obstetrics

## 2023-04-14 DIAGNOSIS — Z1231 Encounter for screening mammogram for malignant neoplasm of breast: Secondary | ICD-10-CM

## 2023-05-07 ENCOUNTER — Other Ambulatory Visit (HOSPITAL_BASED_OUTPATIENT_CLINIC_OR_DEPARTMENT_OTHER): Payer: Self-pay

## 2023-05-07 MED ORDER — COVID-19 MRNA VAC-TRIS(PFIZER) 30 MCG/0.3ML IM SUSY
0.3000 mL | PREFILLED_SYRINGE | Freq: Once | INTRAMUSCULAR | 0 refills | Status: AC
  Filled 2023-05-07: qty 0.3, 1d supply, fill #0

## 2023-08-21 ENCOUNTER — Other Ambulatory Visit: Payer: Self-pay | Admitting: Internal Medicine

## 2023-08-21 DIAGNOSIS — M545 Low back pain, unspecified: Secondary | ICD-10-CM

## 2023-08-27 ENCOUNTER — Ambulatory Visit
Admission: RE | Admit: 2023-08-27 | Discharge: 2023-08-27 | Disposition: A | Discharge status: Home / Self Care | Source: Ambulatory Visit | Attending: Internal Medicine | Admitting: Internal Medicine

## 2023-08-27 DIAGNOSIS — M545 Low back pain, unspecified: Secondary | ICD-10-CM

## 2023-11-06 ENCOUNTER — Other Ambulatory Visit (HOSPITAL_BASED_OUTPATIENT_CLINIC_OR_DEPARTMENT_OTHER): Payer: Self-pay

## 2023-11-06 MED ORDER — COMIRNATY 30 MCG/0.3ML IM SUSY
0.3000 mL | PREFILLED_SYRINGE | Freq: Once | INTRAMUSCULAR | 0 refills | Status: AC
  Filled 2023-11-06: qty 0.3, 1d supply, fill #0

## 2024-01-05 NOTE — Therapy (Unsigned)
 OUTPATIENT PHYSICAL THERAPY THORACOLUMBAR EVALUATION   Patient Name: Tonya Norton MRN: [id number] DOB:06/17/1955, 68 y.o., female Today's Date: 01/07/2024  END OF SESSION:  PT End of Session - 01/07/24 1009     Visit Number 1    Date for Recertification  03/05/24    Authorization Type mcr    PT Start Time 0850    PT Stop Time 0930    PT Time Calculation (min) 40 min    Activity Tolerance Patient tolerated treatment well;Patient limited by pain    Behavior During Therapy [facility] for tasks assessed/performed          Past Medical History:  Diagnosis Date   Anxiety    Chronic fatigue syndrome    Closed head injury with concussion    Cystic breast    DDD (degenerative disc disease)    Depression    Family history of adverse reaction to anesthesia    father had N/V under general anesthesia   GERD (gastroesophageal reflux disease)    Heart murmur    HSV (herpes simplex virus) infection    Hypertension    Hypothyroidism    Migraines    Menstrual   Mitral regurgitation    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, myxomatous mitral valve   Mitral regurgitation 12/05/2020   Mitral valve prolapse    Nocturia 07/23/2013   Raynaud's disease    Rosacea    Snoring 07/23/2013   Thyroid  nodule    Past Surgical History:  Procedure Laterality Date   ARTERY BIOPSY Right 04/01/2022   Procedure: RIGHT BIOPSY TEMPORAL ARTERY;  Surgeon: Gretta Lonni PARAS, MD;  Location: [facility] OR;  Service: Vascular;  Laterality: Right;   benign right breast biopsy     BREAST EXCISIONAL BIOPSY Right    DILATION AND CURETTAGE OF UTERUS     laser to cervix     MYOMECTOMY  1986   SHOULDER SURGERY Right    Patient Active Problem List   Diagnosis Date Noted   Right sided temporal headache 03/26/2022   Mitral regurgitation 12/05/2020   Stenosis of left subclavian artery 07/25/2020   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   Saw John O'Hallaron  (PT) he believes problem is the cysts. I was unable to tolerate any land based therapy, increased my pain. [facility] in Feb. I have modified my activity to avoid increased pain. Can't bend or lift. Wanted to try aquatic therapy to see if I could exercise there to try to maintain some strength.  Feel like I am getting weak.  PERTINENT HISTORY:  Rt sacral pain and foot paraesthesias  PAIN:  Are you having pain? Yes: NPRS scale: current 3/10; worst 5-6/10; least 1/10 Pain location: Pain in LB with numbness into right foot  Pain description: ache; numbness Aggravating factors: positional depended: walking 10 mins; flex; lifting Relieving factors: lying flat  PRECAUTIONS: None  RED FLAGS: None   WEIGHT BEARING RESTRICTIONS: No  FALLS:  Has patient fallen in last 6 months? No  OCCUPATION: retired pensions consultant  PLOF: Independent  PATIENT GOALS: get some exercises and move  NEXT MD VISIT: Feb  OBJECTIVE:  Note: Objective measures were completed at Evaluation unless otherwise noted.  DIAGNOSTIC FINDINGS:  7/25 Lumbar MRI FINDINGS: Segmentation:  Standard.   Alignment: Lumbar lordosis is maintained. No significant listhesis.   Vertebrae: No bone marrow edema or  evidence of fracture. Vertebral body heights are maintained. No suspicious osseous lesion.   Conus medullaris and cauda equina: Conus extends to the T12-L1 level. Conus and cauda equina appear normal.   Paraspinal and other soft tissues: The paraspinal soft tissues are unremarkable. Prominent Tarlov cyst along the dorsal aspect of the sacrum particularly at the S2 level, largest cyst measures 3.1 x 2.0 x 2.9 cm.   Disc levels:   T12-L1: No significant disc bulge. No significant spinal canal stenosis or foraminal stenosis. Mild facet arthrosis.   L1-2: No significant disc bulge. No significant spinal canal stenosis or foraminal stenosis.   L2-3: No significant disc bulge. Mild-to-moderate facet arthrosis and thickening of the ligamentum flavum. No significant spinal canal or foraminal stenosis.   L3-4: No significant disc bulge. Moderate facet arthrosis and thickening of the ligamentum flavum. No significant spinal canal or foraminal stenosis.   L4-5: Disc desiccation and mild disc height loss. Central/right paracentral annular fissure measuring up to 9 mm in length. Broad-based central disc protrusion which indents the ventral thecal sac. Mild lateral recess narrowing on the left. Moderate facet arthrosis and thickening of the ligamentum flavum. No significant spinal canal or foraminal stenosis.   L5-S1: Disc desiccation and mild-to-moderate disc height loss. Annular fissure extending from the right paracentral region into the left subarticular/foraminal region over length of approximately 14 mm. No significant spinal canal or foraminal stenosis.   IMPRESSION: Degenerative changes as above. No high-grade spinal canal or foraminal stenosis.   Annular fissures at the L4-5 and L5-S1 levels as described above.   Prominent Tarlov cysts.  PATIENT SURVEYS:  ODI:  23/50=46%  COGNITION: Overall cognitive status: Within functional limits for tasks assessed       MUSCLE  LENGTH: Hamstrings: tight   POSTURE: decreased lumbar lordosis   LUMBAR ROM:   AROM eval  Flexion Full P!  Extension P!  Right lateral flexion   Left lateral flexion   Right rotation   Left rotation    (Blank rows = not tested)  LOWER EXTREMITY ROM:     WFL  LOWER EXTREMITY MMT:    MMT  Right eval Left eval  Hip flexion 22.2 28.4  Hip extension    Hip abduction 18.6 23.4  Hip adduction    Hip internal rotation    Hip external rotation    Knee flexion    Knee extension 38.6 42.5  Ankle dorsiflexion    Ankle plantarflexion    Ankle inversion    Ankle eversion     (Blank rows = not tested)   FUNCTIONAL TESTS:  Timed up and go (TUG): 9.29   4 stage balance: passed x 4 GAIT: Distance walked: 500 Assistive device utilized: None Level of assistance: Complete Independence Comments: wfl  TREATMENT  Eval Self care:Posture and optometrist instruction                                                                                                                               PATIENT EDUCATION:  Education details: Discussed eval findings, rehab rationale, aquatic program progression/POC and pools in area. Patient is in agreement  Person educated: Patient Education method: Explanation Education comprehension: verbalized understanding  HOME EXERCISE PROGRAM: Does not tolerate land based ex. Aquatic TBA  ASSESSMENT:  CLINICAL IMPRESSION: Patient is a 68 y.o. f who was seen today for physical therapy evaluation and treatment for SIJ pain. She has had injections without relief.  She has prominent Tarlov cysts in lower lumbar spine into S1 area which she believes to be the source of her pain.  She has an appt with a specialist at [facility] in [place] in Feb to address.  She is moderately pain sensitive in lumbosacral area more prominently on R vs L with numbness in foot after activity in standing > 15 mins.  Lying supine with knees elevated is only position that  eliminates pain. Strength deficits in le and core. Pt goal is to be able to exercise maintaining strength as able without excessive pain. She is a good candidate for aquatic intervention and will benefit from the properties of water to progress towards functional goals.   OBJECTIVE IMPAIRMENTS: decreased activity tolerance, decreased mobility, difficulty walking, decreased strength, and pain.   ACTIVITY LIMITATIONS: carrying, lifting, bending, sitting, standing, transfers, and locomotion level  PARTICIPATION LIMITATIONS: cleaning, laundry, shopping, community activity, and yard work  [facility] POTENTIAL: Good  CLINICAL DECISION MAKING: Stable/uncomplicated  EVALUATION COMPLEXITY: Low   GOALS: Goals reviewed with patient? Yes  SHORT TERM GOALS: Target date: 02/19/23  Pt will tolerate full aquatic sessions consistently without increase in pain and with improving function to demonstrate good toleration and effectiveness of intervention.  Baseline: Goal status: INITIAL  2.  Pt will report decrease in pain by at least 50% for improved toleration to activity/quality of life and to demonstrate improved management of pain. Baseline:  Goal status: INITIAL  3.  Pt will consider gaining pool access for use of the properties of water for chronic conditions maintaining mobility and minimizing pain. Baseline:  Goal status: INITIAL    LONG TERM GOALS:  Target date: 03/05/24  Pt to improve on ODI by 13% to demonstrate statistically significant Improvement in function. (MCID 13-15%) Baseline: 23/50=46% Goal status: INITIAL  2.  Pt will improve strength in all areas listed by 5lbs to demonstrate improved overall physical function Baseline:  Goal status: INITIAL  3.  Pt will be indep with final aquatic HEP for continued management of condition Baseline:  Goal status: INITIAL  4.  Pt will be able to tolerate  Baseline:  Goal status: INITIAL    PLAN:  PT FREQUENCY: 1-2x/week  PT  DURATION: 8 weeks  PLANNED INTERVENTIONS: 97110-Therapeutic exercises, 97530- Therapeutic activity, 97112- Neuromuscular re-education, 97535- Self Care, [id number]- Manual therapy, [phone]- Gait training, [phone]- Aquatic Therapy, [phone] (1-2 muscles), [id number] (3+ muscles)- Dry Needling, Patient/Family education, Balance training, Stair training, Taping, Joint mobilization, DME instructions, Cryotherapy, and Moist heat.  PLAN FOR NEXT SESSION: aquatic only: trail for pain management, toleration to movement and strengthening.   Ronal Midway) Zoeie Ritter MPT 01/07/24 12:25 PM [facility] [address] [place], [place], [address] Phone: [phone]   Fax:  [phone]

## 2024-01-07 ENCOUNTER — Ambulatory Visit (HOSPITAL_BASED_OUTPATIENT_CLINIC_OR_DEPARTMENT_OTHER): Admitting: Physical Therapy

## 2024-01-07 ENCOUNTER — Encounter (HOSPITAL_BASED_OUTPATIENT_CLINIC_OR_DEPARTMENT_OTHER): Payer: Self-pay | Admitting: Physical Therapy

## 2024-01-07 ENCOUNTER — Other Ambulatory Visit: Payer: Self-pay

## 2024-01-07 DIAGNOSIS — R2689 Other abnormalities of gait and mobility: Secondary | ICD-10-CM | POA: Diagnosis present

## 2024-01-07 DIAGNOSIS — M6281 Muscle weakness (generalized): Secondary | ICD-10-CM | POA: Diagnosis present

## 2024-01-07 DIAGNOSIS — M5459 Other low back pain: Secondary | ICD-10-CM | POA: Diagnosis present

## 2024-01-12 ENCOUNTER — Encounter (HOSPITAL_BASED_OUTPATIENT_CLINIC_OR_DEPARTMENT_OTHER): Payer: Self-pay | Admitting: Physical Therapy

## 2024-01-12 ENCOUNTER — Ambulatory Visit (HOSPITAL_BASED_OUTPATIENT_CLINIC_OR_DEPARTMENT_OTHER): Admitting: Physical Therapy

## 2024-01-12 DIAGNOSIS — M6281 Muscle weakness (generalized): Secondary | ICD-10-CM

## 2024-01-12 DIAGNOSIS — M5459 Other low back pain: Secondary | ICD-10-CM

## 2024-01-12 DIAGNOSIS — R2689 Other abnormalities of gait and mobility: Secondary | ICD-10-CM

## 2024-01-12 NOTE — Therapy (Signed)
 OUTPATIENT PHYSICAL THERAPY THORACOLUMBAR TREATMENT   Patient Name: Tonya Norton MRN: [id number] DOB:September 04, 1955, 68 y.o., female Today's Date: 01/12/2024  END OF SESSION:  PT End of Session - 01/12/24 0932     Visit Number 2    Date for Recertification  03/05/24    Authorization Type mcr    Progress Note Due on Visit 10    PT Start Time 0933    PT Stop Time 1015    PT Time Calculation (min) 42 min    Activity Tolerance Patient tolerated treatment well;Patient limited by pain    Behavior During Therapy [facility] for tasks assessed/performed          Past Medical History:  Diagnosis Date   Anxiety    Chronic fatigue syndrome    Closed head injury with concussion    Cystic breast    DDD (degenerative disc disease)    Depression    Family history of adverse reaction to anesthesia    father had N/V under general anesthesia   GERD (gastroesophageal reflux disease)    Heart murmur    HSV (herpes simplex virus) infection    Hypertension    Hypothyroidism    Migraines    Menstrual   Mitral regurgitation    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, myxomatous mitral valve   Mitral regurgitation 12/05/2020   Mitral valve prolapse    Nocturia 07/23/2013   Raynaud's disease    Rosacea    Snoring 07/23/2013   Thyroid  nodule    Past Surgical History:  Procedure Laterality Date   ARTERY BIOPSY Right 04/01/2022   Procedure: RIGHT BIOPSY TEMPORAL ARTERY;  Surgeon: Gretta Lonni PARAS, MD;  Location: [facility] OR;  Service: Vascular;  Laterality: Right;   benign right breast biopsy     BREAST EXCISIONAL BIOPSY Right    DILATION AND CURETTAGE OF UTERUS     laser to cervix     MYOMECTOMY  1986   SHOULDER SURGERY Right    Patient Active Problem List   Diagnosis Date Noted   Right sided temporal headache 03/26/2022   Mitral regurgitation 12/05/2020   Stenosis of left subclavian artery 07/25/2020   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 My pain right now is about 4/10   Initial Subjective My SIJ is OK. 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   Saw John O'Hallaron  (PT) he believes problem is the cysts. I was unable to tolerate any land based therapy, increased my pain. [facility] in [facility]. I have modified my activity to avoid increased pain. Can't bend or lift. Wanted to try aquatic therapy to see if I could exercise there to try to maintain some strength.  Feel like I am getting weak.  PERTINENT HISTORY:  Rt sacral pain and foot paraesthesias  PAIN:  Are you having pain? Yes: NPRS scale: current 3/10; worst 5-6/10; least 1/10 Pain location: Pain in LB with numbness into right foot  Pain description: ache; numbness Aggravating factors: positional depended: walking 10 mins; flex; lifting Relieving factors: lying flat  PRECAUTIONS: None  RED FLAGS: None   WEIGHT BEARING RESTRICTIONS: No  FALLS:  Has patient fallen in last 6 months? No  OCCUPATION: retired pensions consultant  PLOF: Independent  PATIENT GOALS: get some exercises and move  NEXT MD VISIT: Feb  OBJECTIVE:  Note: Objective measures were completed at Evaluation unless otherwise noted.  DIAGNOSTIC  FINDINGS:  7/25 Lumbar MRI FINDINGS: Segmentation:  Standard.   Alignment: Lumbar lordosis is maintained. No significant listhesis.   Vertebrae: No bone marrow edema or evidence of fracture. Vertebral body heights are maintained. No suspicious osseous lesion.   Conus medullaris and cauda equina: Conus extends to the T12-L1 level. Conus and cauda equina appear normal.   Paraspinal and other soft tissues: The paraspinal soft tissues are unremarkable. Prominent Tarlov cyst along the dorsal aspect of the sacrum particularly at the S2 level, largest cyst measures 3.1 x 2.0 x 2.9 cm.   Disc levels:   T12-L1: No significant disc bulge. No significant spinal canal stenosis or foraminal stenosis. Mild facet arthrosis.   L1-2: No significant disc bulge. No significant spinal canal stenosis or foraminal stenosis.   L2-3: No significant disc bulge. Mild-to-moderate facet arthrosis and thickening of the ligamentum flavum. No significant spinal canal or foraminal stenosis.   L3-4: No significant disc bulge. Moderate facet arthrosis and thickening of the ligamentum flavum. No significant spinal canal or foraminal stenosis.   L4-5: Disc desiccation and mild disc height loss. Central/right paracentral annular fissure measuring up to 9 mm in length. Broad-based central disc protrusion which indents the ventral thecal sac. Mild lateral recess narrowing on the left. Moderate facet arthrosis and thickening of the ligamentum flavum. No significant spinal canal or foraminal stenosis.   L5-S1: Disc desiccation and mild-to-moderate disc height loss. Annular fissure extending from the right paracentral region into the left subarticular/foraminal region over length of approximately 14 mm. No significant spinal canal or foraminal stenosis.   IMPRESSION: Degenerative changes as above. No high-grade spinal canal or foraminal stenosis.   Annular fissures at the L4-5 and L5-S1 levels as described  above.   Prominent Tarlov cysts.  PATIENT SURVEYS:  ODI:  23/50=46%  COGNITION: Overall cognitive status: Within functional limits for tasks assessed       MUSCLE LENGTH: Hamstrings: tight   POSTURE: decreased lumbar lordosis   LUMBAR ROM:   AROM eval  Flexion Full P!  Extension P!  Right lateral flexion   Left lateral flexion   Right rotation  Left rotation    (Blank rows = not tested)  LOWER EXTREMITY ROM:     WFL  LOWER EXTREMITY MMT:    MMT Right eval Left eval  Hip flexion 22.2 28.4  Hip extension    Hip abduction 18.6 23.4  Hip adduction    Hip internal rotation    Hip external rotation    Knee flexion    Knee extension 38.6 42.5  Ankle dorsiflexion    Ankle plantarflexion    Ankle inversion    Ankle eversion     (Blank rows = not tested)   FUNCTIONAL TESTS:  Timed up and go (TUG): 9.29   4 stage balance: passed x 4 GAIT: Distance walked: 500 Assistive device utilized: None Level of assistance: Complete Independence Comments: wfl  TREATMENT  [facility] Adult PT Treatment:                                                DATE: 01/12/24 Pt seen for aquatic therapy today.  Treatment took place in water 3.5-4.75 ft in depth at the [facility] pool. Temp of water was 91.  Pt entered/exited the pool via stairs with hand rail.  *Intro to setting *walking forward, back and side stepping in 3.6-4.6 ft with unsupported *using yellow noodle wrapped anteriorly across chest prone suspension ue support corner wall x 3 holding ~30 s *straddling yellow noodle decompression then cycling (not tolerated) *return to noodle wrapped anteriorly: hip addct/abct (tolerated fair) *suspended supine with nekdoodle and 3 noodles  Pt requires the buoyancy and hydrostatic pressure of water for support, and to offload joints by unweighting joint load by at least 50 % in navel deep water and by at least 75-80% in chest to neck deep water.  Viscosity of the water is  needed for resistance of strengthening. Water current perturbations provides challenge to standing balance requiring increased core activation.                                                                                                                                PATIENT EDUCATION:  Education details: Discussed eval findings, rehab rationale, aquatic program progression/POC and pools in area. Patient is in agreement  Person educated: Patient Education method: Explanation Education comprehension: verbalized understanding  HOME EXERCISE PROGRAM: Does not tolerate land based ex. Aquatic TBA  ASSESSMENT:  CLINICAL IMPRESSION: Pt demonstrates safety and independence in aquatic setting with therapist instructing from deck. She is confident in setting, moving throughout all depths easily.  Pt is directed through various movement patterns and trials in both sitting and standing positions.   She is provided VC and demonstration throughout session for execution of exercises while monitoring toleration. Of note: Cycling on noodle (straddled) not tolerated, suspended in prone questionable.  Pt tolerates ~ 30 minutes of exercises with  2-3 rest periods throughout. Plan to assess toleration next session.  Goals are ongoing.     Initial Impression Patient is a 68 y.o. f who was seen today for physical therapy evaluation and treatment for SIJ pain. She has had injections without relief.  She has prominent Tarlov cysts in lower lumbar spine into S1 area which she believes to be the source of her pain.  She has an appt with a specialist at [facility] in [place] in Feb to address.  She is moderately pain sensitive in lumbosacral area more prominently on R vs L with numbness in foot after activity in standing > 15 mins.  Lying supine with knees elevated is only position that eliminates pain. Strength deficits in le and core. Pt goal is to be able to exercise maintaining strength as able without excessive pain.  She is a good candidate for aquatic intervention and will benefit from the properties of water to progress towards functional goals.   OBJECTIVE IMPAIRMENTS: decreased activity tolerance, decreased mobility, difficulty walking, decreased strength, and pain.   ACTIVITY LIMITATIONS: carrying, lifting, bending, sitting, standing, transfers, and locomotion level  PARTICIPATION LIMITATIONS: cleaning, laundry, shopping, community activity, and yard work  [facility] POTENTIAL: Good  CLINICAL DECISION MAKING: Stable/uncomplicated  EVALUATION COMPLEXITY: Low   GOALS: Goals reviewed with patient? Yes  SHORT TERM GOALS: Target date: 02/19/23  Pt will tolerate full aquatic sessions consistently without increase in pain and with improving function to demonstrate good toleration and effectiveness of intervention.  Baseline: Goal status: INITIAL  2.  Pt will report decrease in pain by at least 50% for improved toleration to activity/quality of life and to demonstrate improved management of pain. Baseline:  Goal status: INITIAL  3.  Pt will consider gaining pool access for use of the properties of water for chronic conditions maintaining mobility and minimizing pain. Baseline:  Goal status: INITIAL    LONG TERM GOALS: Target date: 03/05/24  Pt to improve on ODI by 13% to demonstrate statistically significant Improvement in function. (MCID 13-15%) Baseline: 23/50=46% Goal status: INITIAL  2.  Pt will improve strength in all areas listed by 5lbs to demonstrate improved overall physical function Baseline:  Goal status: INITIAL  3.  Pt will be indep with final aquatic HEP for continued management of condition Baseline:  Goal status: INITIAL  4.  Pt will be able to tolerate  Baseline:  Goal status: INITIAL    PLAN:  PT FREQUENCY: 1-2x/week  PT DURATION: 8 weeks  PLANNED INTERVENTIONS: 97110-Therapeutic exercises, 97530- Therapeutic activity, 97112- Neuromuscular re-education, 97535-  Self Care, 02859- Manual therapy, [phone]- Gait training, [phone]- Aquatic Therapy, [phone] (1-2 muscles), [phone] (3+ muscles)- Dry Needling, Patient/Family education, Balance training, Stair training, Taping, Joint mobilization, DME instructions, Cryotherapy, and Moist heat.  PLAN FOR NEXT SESSION: aquatic only: trail for pain management, toleration to movement and strengthening.   [address] [place]) Laverne Hursey MPT 01/12/24 10:22 AM [facility] [address] [place], [place], [address] Phone: [phone]   Fax:  [phone]

## 2024-01-22 ENCOUNTER — Ambulatory Visit (HOSPITAL_BASED_OUTPATIENT_CLINIC_OR_DEPARTMENT_OTHER): Payer: Self-pay | Admitting: Physical Therapy

## 2024-01-22 ENCOUNTER — Encounter (HOSPITAL_BASED_OUTPATIENT_CLINIC_OR_DEPARTMENT_OTHER): Payer: Self-pay | Admitting: Physical Therapy

## 2024-01-22 DIAGNOSIS — M5459 Other low back pain: Secondary | ICD-10-CM

## 2024-01-22 DIAGNOSIS — R2689 Other abnormalities of gait and mobility: Secondary | ICD-10-CM

## 2024-01-22 DIAGNOSIS — M6281 Muscle weakness (generalized): Secondary | ICD-10-CM

## 2024-01-22 NOTE — Therapy (Signed)
 OUTPATIENT PHYSICAL THERAPY THORACOLUMBAR TREATMENT   Patient Name: Tonya Norton MRN: [id number] DOB:Oct 19, 1955, 68 y.o., female Today's Date: 01/22/2024  END OF SESSION:  PT End of Session - 01/22/24 1048     Visit Number 3    Date for Recertification  03/05/24    Authorization Type mcr    Progress Note Due on Visit 10    PT Start Time 1053    PT Stop Time 1131    PT Time Calculation (min) 38 min    Activity Tolerance Patient tolerated treatment well;Patient limited by pain    Behavior During Therapy WFL for tasks assessed/performed          Past Medical History:  Diagnosis Date   Anxiety    Chronic fatigue syndrome    Closed head injury with concussion    Cystic breast    DDD (degenerative disc disease)    Depression    Family history of adverse reaction to anesthesia    father had N/V under general anesthesia   GERD (gastroesophageal reflux disease)    Heart murmur    HSV (herpes simplex virus) infection    Hypertension    Hypothyroidism    Migraines    Menstrual   Mitral regurgitation    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, myxomatous mitral valve   Mitral regurgitation 12/05/2020   Mitral valve prolapse    Nocturia 07/23/2013   Raynaud's disease    Rosacea    Snoring 07/23/2013   Thyroid  nodule    Past Surgical History:  Procedure Laterality Date   ARTERY BIOPSY Right 04/01/2022   Procedure: RIGHT BIOPSY TEMPORAL ARTERY;  Surgeon: Gretta Lonni PARAS, MD;  Location: [facility] OR;  Service: Vascular;  Laterality: Right;   benign right breast biopsy     BREAST EXCISIONAL BIOPSY Right    DILATION AND CURETTAGE OF UTERUS     laser to cervix     MYOMECTOMY  1986   SHOULDER SURGERY Right    Patient Active Problem List   Diagnosis Date Noted   Right sided temporal headache 03/26/2022   Mitral regurgitation 12/05/2020   Stenosis of left subclavian artery 07/25/2020   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   HTN (hypertension) 05/31/2020   HLD (hyperlipidemia)  05/31/2020   Snoring 07/23/2013   Nocturia 07/23/2013    PCP: Oneil Neth MD REFERRING PROVIDER: Darryle Aver MD  REFERRING DIAG: M53.3 (ICD-10-CM) - Sacrococcygeal disorders, not elsewhere classified   Rationale for Evaluation and Treatment: Rehabilitation  THERAPY DIAG:  Other low back pain  Muscle weakness (generalized)  Other abnormalities of gait and mobility  ONSET DATE: 1 yr  SUBJECTIVE:  SUBJECTIVE STATEMENT: It took a few minutes for me to get my land legs back on after getting out of the pool.  I may have had a small increase in pain after wards but not bad.  Pain today 2-3/10   Initial Subjective My SIJ is OK. 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   Saw John O'Hallaron  (PT) he believes problem is the cysts. I was unable to tolerate any land based therapy, increased my pain. [facility] a specialist in Feb. I have modified my activity to avoid increased pain. Can't bend or lift. Wanted to try aquatic therapy to see if I could exercise there to try to maintain some strength.  Feel like I am getting weak.  PERTINENT HISTORY:  Rt sacral pain and foot paraesthesias  PAIN:  Are you having pain? Yes: NPRS scale: current 3/10; worst 5-6/10; least 1/10 Pain location: Pain in LB with numbness into right foot  Pain description: ache; numbness Aggravating factors: positional depended: walking 10 mins; flex; lifting Relieving factors: lying flat  PRECAUTIONS: None  RED FLAGS: None   WEIGHT BEARING RESTRICTIONS: No  FALLS:  Has patient fallen in last 6 months? No  OCCUPATION: retired pensions consultant  PLOF: Independent  PATIENT GOALS: get some exercises and move  NEXT MD VISIT: Feb  OBJECTIVE:  Note: Objective measures were  completed at Evaluation unless otherwise noted.  DIAGNOSTIC FINDINGS:  7/25 Lumbar MRI FINDINGS: Segmentation:  Standard.   Alignment: Lumbar lordosis is maintained. No significant listhesis.   Vertebrae: No bone marrow edema or evidence of fracture. Vertebral body heights are maintained. No suspicious osseous lesion.   Conus medullaris and cauda equina: Conus extends to the T12-L1 level. Conus and cauda equina appear normal.   Paraspinal and other soft tissues: The paraspinal soft tissues are unremarkable. Prominent Tarlov cyst along the dorsal aspect of the sacrum particularly at the S2 level, largest cyst measures 3.1 x 2.0 x 2.9 cm.   Disc levels:   T12-L1: No significant disc bulge. No significant spinal canal stenosis or foraminal stenosis. Mild facet arthrosis.   L1-2: No significant disc bulge. No significant spinal canal stenosis or foraminal stenosis.   L2-3: No significant disc bulge. Mild-to-moderate facet arthrosis and thickening of the ligamentum flavum. No significant spinal canal or foraminal stenosis.   L3-4: No significant disc bulge. Moderate facet arthrosis and thickening of the ligamentum flavum. No significant spinal canal or foraminal stenosis.   L4-5: Disc desiccation and mild disc height loss. Central/right paracentral annular fissure measuring up to 9 mm in length. Broad-based central disc protrusion which indents the ventral thecal sac. Mild lateral recess narrowing on the left. Moderate facet arthrosis and thickening of the ligamentum flavum. No significant spinal canal or foraminal stenosis.   L5-S1: Disc desiccation and mild-to-moderate disc height loss. Annular fissure extending from the right paracentral region into the left subarticular/foraminal region over length of approximately 14 mm. No significant spinal canal or foraminal stenosis.   IMPRESSION: Degenerative changes as above. No high-grade spinal canal or foraminal stenosis.    Annular fissures at the L4-5 and L5-S1 levels as described above.   Prominent Tarlov cysts.  PATIENT SURVEYS:  ODI:  23/50=46%  COGNITION: Overall cognitive status: Within functional limits for tasks assessed       MUSCLE LENGTH: Hamstrings: tight   POSTURE: decreased lumbar lordosis   LUMBAR ROM:   AROM eval  Flexion Full P!  Extension  P!  Right lateral flexion   Left lateral flexion   Right rotation   Left rotation    (Blank rows = not tested)  LOWER EXTREMITY ROM:     WFL  LOWER EXTREMITY MMT:    MMT Right eval Left eval  Hip flexion 22.2 28.4  Hip extension    Hip abduction 18.6 23.4  Hip adduction    Hip internal rotation    Hip external rotation    Knee flexion    Knee extension 38.6 42.5  Ankle dorsiflexion    Ankle plantarflexion    Ankle inversion    Ankle eversion     (Blank rows = not tested)   FUNCTIONAL TESTS:  Timed up and go (TUG): 9.29   4 stage balance: passed x 4 GAIT: Distance walked: 500 Assistive device utilized: None Level of assistance: Complete Independence Comments: wfl  TREATMENT  [facility] Adult PT Treatment:                                                DATE: 01/22/24 Pt seen for aquatic therapy today.  Treatment took place in water 3.5-4.75 ft in depth at the [facility] pool. Temp of water was 91.  Pt entered/exited the pool via stairs with hand rail.   *walking forward, back and side stepping in 3.6-4.6 ft with unsupported x 5 minutes  *using yellow noodle wrapped anteriorly across chest prone suspension ue support corner wall x 3 holding ~30 s *return to noodle wrapped anteriorly: hip addct/abct x10; BKTC 2 x 5 *wrapped posteriorly cycling; decompression *1/2 noodle pull down for TrA engagement wide stance x 8, staggered stance x 5 *ue support noodle 4.5 ft: toe raises; heel raises x 10; hip add/abd x 5; hip flex/ext x 5   Pt requires the buoyancy and hydrostatic pressure of water for support, and to  offload joints by unweighting joint load by at least 50 % in navel deep water and by at least 75-80% in chest to neck deep water.  Viscosity of the water is needed for resistance of strengthening. Water current perturbations provides challenge to standing balance requiring increased core activation.                                                                                                                                PATIENT EDUCATION:  Education details: Discussed eval findings, rehab rationale, aquatic program progression/POC and pools in area. Patient is in agreement  Person educated: Patient Education method: Explanation Education comprehension: verbalized understanding  HOME EXERCISE PROGRAM: Does not tolerate land based ex. Aquatic TBA  ASSESSMENT:  CLINICAL IMPRESSION: Pt reports positive response after last session.  Some slight increase in pain but feels the movement and exercises she was able to tolerate out weighs the slight increase in pain for  a short while.  I added standing exercises today in 4.5 ft using buoyancy to manage pain as able.  Plan to add/subtract exercises going forward dependent on toleration as reported with each session .  Todays toleration during treat good, increase in pain ~1 NPRS. Goals onging      Initial Impression Patient is a 68 y.o. f who was seen today for physical therapy evaluation and treatment for SIJ pain. She has had injections without relief.  She has prominent Tarlov cysts in lower lumbar spine into S1 area which she believes to be the source of her pain.  She has an appt with a specialist at [facility] in [place] in Feb to address.  She is moderately pain sensitive in lumbosacral area more prominently on R vs L with numbness in foot after activity in standing > 15 mins.  Lying supine with knees elevated is only position that eliminates pain. Strength deficits in le and core. Pt goal is to be able to exercise maintaining strength as able  without excessive pain. She is a good candidate for aquatic intervention and will benefit from the properties of water to progress towards functional goals.   OBJECTIVE IMPAIRMENTS: decreased activity tolerance, decreased mobility, difficulty walking, decreased strength, and pain.   ACTIVITY LIMITATIONS: carrying, lifting, bending, sitting, standing, transfers, and locomotion level  PARTICIPATION LIMITATIONS: cleaning, laundry, shopping, community activity, and yard work  [employer] [facility] POTENTIAL: Good  CLINICAL DECISION MAKING: Stable/uncomplicated  EVALUATION COMPLEXITY: Low   GOALS: Goals reviewed with patient? Yes  SHORT TERM GOALS: Target date: 02/19/23  Pt will tolerate full aquatic sessions consistently without increase in pain and with improving function to demonstrate good toleration and effectiveness of intervention.  Baseline: Goal status: INITIAL  2.  Pt will report decrease in pain by at least 50% for improved toleration to activity/quality of life and to demonstrate improved management of pain. Baseline:  Goal status: INITIAL  3.  Pt will consider gaining pool access for use of the properties of water for chronic conditions maintaining mobility and minimizing pain. Baseline:  Goal status: INITIAL    LONG TERM GOALS: Target date: 03/05/24  Pt to improve on ODI by 13% to demonstrate statistically significant Improvement in function. (MCID 13-15%) Baseline: 23/50=46% Goal status: INITIAL  2.  Pt will improve strength in all areas listed by 5lbs to demonstrate improved overall physical function Baseline:  Goal status: INITIAL  3.  Pt will be indep with final aquatic HEP for continued management of condition Baseline:  Goal status: INITIAL  4.  Pt will be able to tolerate  Baseline:  Goal status: INITIAL    PLAN:  PT FREQUENCY: 1-2x/week  PT DURATION: 8 weeks  PLANNED INTERVENTIONS: 97110-Therapeutic exercises, 97530- Therapeutic activity, 97112-  Neuromuscular re-education, 97535- Self Care, 02859- Manual therapy, [phone]- Gait training, [phone]- Aquatic Therapy, [phone] (1-2 muscles), 20561 (3+ muscles)- Dry Needling, Patient/Family education, Balance training, Stair training, Taping, Joint mobilization, DME instructions, Cryotherapy, and Moist heat.  PLAN FOR NEXT SESSION: aquatic only: trail for pain management, toleration to movement and strengthening.   Ronal Mer[place]) Zackeriah Kissler MPT 01/22/2024 11:33 AM [facility] [address] [place], [place], [address] Phone: [phone]   Fax:  [phone]

## 2024-01-27 ENCOUNTER — Encounter (HOSPITAL_BASED_OUTPATIENT_CLINIC_OR_DEPARTMENT_OTHER): Payer: Self-pay | Admitting: Physical Therapy

## 2024-01-27 ENCOUNTER — Ambulatory Visit (HOSPITAL_BASED_OUTPATIENT_CLINIC_OR_DEPARTMENT_OTHER): Payer: Self-pay | Admitting: Physical Therapy

## 2024-01-27 DIAGNOSIS — M5459 Other low back pain: Secondary | ICD-10-CM

## 2024-01-27 DIAGNOSIS — M6281 Muscle weakness (generalized): Secondary | ICD-10-CM

## 2024-01-27 DIAGNOSIS — R2689 Other abnormalities of gait and mobility: Secondary | ICD-10-CM

## 2024-01-27 NOTE — Therapy (Signed)
 " OUTPATIENT PHYSICAL THERAPY THORACOLUMBAR TREATMENT   Patient Name: Tonya Norton MRN: [id number] DOB:Sep 19, 1955, 68 y.o., female Today's Date: 01/27/2024  END OF SESSION:  PT End of Session - 01/27/24 1122     Visit Number 4    Date for Recertification  03/05/24    Authorization Type mcr    Progress Note Due on Visit 10    PT Start Time 1045    PT Stop Time 1125    PT Time Calculation (min) 40 min    Activity Tolerance Patient tolerated treatment well;Patient limited by pain    Behavior During Therapy [facility] for tasks assessed/performed           Past Medical History:  Diagnosis Date   Anxiety    Chronic fatigue syndrome    Closed head injury with concussion    Cystic breast    DDD (degenerative disc disease)    Depression    Family history of adverse reaction to anesthesia    father had N/V under general anesthesia   GERD (gastroesophageal reflux disease)    Heart murmur    HSV (herpes simplex virus) infection    Hypertension    Hypothyroidism    Migraines    Menstrual   Mitral regurgitation    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, myxomatous mitral valve   Mitral regurgitation 12/05/2020   Mitral valve prolapse    Nocturia 07/23/2013   Raynaud's disease    Rosacea    Snoring 07/23/2013   Thyroid  nodule    Past Surgical History:  Procedure Laterality Date   ARTERY BIOPSY Right 04/01/2022   Procedure: RIGHT BIOPSY TEMPORAL ARTERY;  Surgeon: Gretta Lonni PARAS, MD;  Location: [facility] OR;  Service: Vascular;  Laterality: Right;   benign right breast biopsy     BREAST EXCISIONAL BIOPSY Right    DILATION AND CURETTAGE OF UTERUS     laser to cervix     MYOMECTOMY  1986   SHOULDER SURGERY Right    Patient Active Problem List   Diagnosis Date Noted   Right sided temporal headache 03/26/2022   Mitral regurgitation 12/05/2020   Stenosis of left subclavian artery 07/25/2020   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 Pain today 2-3/10   Initial Subjective My SIJ is OK. 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   Saw John O'Hallaron  (PT) he believes problem is the cysts. I was unable to tolerate any land based therapy, increased my pain. [facility] in [facility]. I have modified my activity to avoid increased pain. Can't bend or lift. Wanted to try aquatic therapy to see if I could exercise there to try to maintain some strength.  Feel like I am getting weak.  PERTINENT HISTORY:  Rt sacral pain and foot paraesthesias  PAIN:  Are you having pain? Yes: NPRS scale: current 3/10; worst 5-6/10; least 1/10 Pain location: Pain in LB with numbness into right foot  Pain description: ache; numbness Aggravating factors: positional depended: walking 10 mins; flex; lifting Relieving factors: lying flat  PRECAUTIONS: None  RED FLAGS: None   WEIGHT BEARING RESTRICTIONS: No  FALLS:  Has patient fallen in last 6 months? No  OCCUPATION: retired pensions consultant  PLOF: Independent  PATIENT GOALS: get some exercises and move  NEXT MD VISIT: Feb  OBJECTIVE:  Note: Objective measures were completed at Evaluation unless otherwise noted.  DIAGNOSTIC FINDINGS:  7/25 Lumbar  MRI FINDINGS: Segmentation:  Standard.   Alignment: Lumbar lordosis is maintained. No significant listhesis.   Vertebrae: No bone marrow edema or evidence of fracture. Vertebral body heights are maintained. No suspicious osseous lesion.   Conus medullaris and cauda equina: Conus extends to the T12-L1 level. Conus and cauda equina appear normal.   Paraspinal and other soft tissues: The paraspinal soft tissues are unremarkable. Prominent Tarlov cyst along the dorsal aspect of the sacrum particularly at the S2 level, largest cyst measures 3.1 x 2.0 x 2.9 cm.   Disc levels:   T12-L1: No significant disc bulge. No significant spinal canal stenosis or foraminal stenosis. Mild facet arthrosis.   L1-2: No significant disc bulge. No significant spinal canal stenosis or foraminal stenosis.   L2-3: No significant disc bulge. Mild-to-moderate facet arthrosis and thickening of the ligamentum flavum. No significant spinal canal or foraminal stenosis.   L3-4: No significant disc bulge. Moderate facet arthrosis and thickening of the ligamentum flavum. No significant spinal canal or foraminal stenosis.   L4-5: Disc desiccation and mild disc height loss. Central/right paracentral annular fissure measuring up to 9 mm in length. Broad-based central disc protrusion which indents the ventral thecal sac. Mild lateral recess narrowing on the left. Moderate facet arthrosis and thickening of the ligamentum flavum. No significant spinal canal or foraminal stenosis.   L5-S1: Disc desiccation and mild-to-moderate disc height loss. Annular fissure extending from the right paracentral region into the left subarticular/foraminal region over length of approximately 14 mm. No significant spinal canal or foraminal stenosis.   IMPRESSION: Degenerative changes as above. No high-grade spinal canal or foraminal stenosis.   Annular fissures at the L4-5 and L5-S1 levels as described above.   Prominent  Tarlov cysts.  PATIENT SURVEYS:  ODI:  23/50=46%  COGNITION: Overall cognitive status: Within functional limits for tasks assessed       MUSCLE LENGTH: Hamstrings: tight   POSTURE: decreased lumbar lordosis   LUMBAR ROM:   AROM eval  Flexion Full P!  Extension P!  Right lateral flexion   Left lateral flexion   Right rotation   Left rotation    (Blank rows = not  tested)  LOWER EXTREMITY ROM:     WFL  LOWER EXTREMITY MMT:    MMT Right eval Left eval  Hip flexion 22.2 28.4  Hip extension    Hip abduction 18.6 23.4  Hip adduction    Hip internal rotation    Hip external rotation    Knee flexion    Knee extension 38.6 42.5  Ankle dorsiflexion    Ankle plantarflexion    Ankle inversion    Ankle eversion     (Blank rows = not tested)   FUNCTIONAL TESTS:  Timed up and go (TUG): 9.29   4 stage balance: passed x 4 GAIT: Distance walked: 500 Assistive device utilized: None Level of assistance: Complete Independence Comments: wfl  TREATMENT  [facility] Adult PT Treatment:                                                DATE: 01/27/24 Pt seen for aquatic therapy today.  Treatment took place in water 3.5-4.75 ft in depth at the [facility] pool. Temp of water was 91.  Pt entered/exited the pool via stairs with hand rail.   *walking forward, back and side stepping in 3.6-4.6 ft with unsupported x 5 minutes *wrapped posteriorly cycling; decompression  *using yellow noodle wrapped anteriorly across chest prone suspension ue support corner wall x 3 holding ~30 s *return to noodle wrapped anteriorly: hip addct/abct x10; BKTC 2 x 5 *1/2 noodle pull down for TrA engagement wide stance x 8, staggered stance x 5 *ue support noodle 4.5 ft: toe raises; heel raises x 10; hip add/abd x 5; hip flex/ext x 5   Pt requires the buoyancy and hydrostatic pressure of water for support, and to offload joints by unweighting joint load by at least 50 % in navel deep water and  by at least 75-80% in chest to neck deep water.  Viscosity of the water is needed for resistance of strengthening. Water current perturbations provides challenge to standing balance requiring increased core activation.                                                                                                                                PATIENT EDUCATION:  Education details: Discussed eval findings, rehab rationale, aquatic program progression/POC and pools in area. Patient is in agreement  Person educated: Patient Education method: Explanation Education comprehension: verbalized understanding  HOME EXERCISE PROGRAM: Does not tolerate land based ex. Aquatic TBA  ASSESSMENT:  CLINICAL IMPRESSION: Slight increase in pain sensitivity today due to picking up 9lb dog earlier.  She states she did have a good response after last session with again slight increase in pain in back by 1-2 NPRS for a few hours then reduced back to her normal.  Session modified just slightly today. She is given multiple rest  periods throughout. Worked Core strengthening in isometrics avoiding any bending or twisting.  Plan to trial light resistance bells next session to assess toleration for more aggressive exercise.  Goals ongoing      Initial Impression Patient is a 68 y.o. f who was seen today for physical therapy evaluation and treatment for SIJ pain. She has had injections without relief.  She has prominent Tarlov cysts in lower lumbar spine into S1 area which she believes to be the source of her pain.  She has an app[facility] a specialist [facility] in [place] in Feb to address.  She is moderately pain sensitive in lumbosacral area more prominently on R vs L with numbness in foot after activity in standing > 15 mins.  Lying supine with knees elevated is only position that eliminates pain. Strength deficits in le and core. Pt goal is to be able to exercise maintaining strength as able without excessive pain.  She is a good candidate for aquatic intervention and will benefit from the properties of water to progress towards functional goals.   OBJECTIVE IMPAIRMENTS: decreased activity tolerance, decreased mobility, difficulty walking, decreased strength, and pain.   ACTIVITY LIMITATIONS: carrying, lifting, bending, sitting, standing, transfers, and locomotion level  PARTICIPATION LIMITATIONS: cleaning, laundry, shopping, community activity, and yard work  [employer] POTENTIAL: Good  CLINICAL DECISION MAKING: Stable/uncomplicated  EVALUATION COMPLEXITY: Low   GOALS: Goals reviewed with patient? Yes  SHORT TERM GOALS: Target date: 02/19/23  Pt will tolerate full aquatic sessions consistently without increase in pain and with improving function to demonstrate good toleration and effectiveness of intervention.  Baseline: Goal status: INITIAL  2.  Pt will report decrease in pain by at least 50% for improved toleration to activity/quality of life and to demonstrate improved management of pain. Baseline:  Goal status: INITIAL  3.  Pt will consider gaining pool access for use of the properties of water for chronic conditions maintaining mobility and minimizing pain. Baseline:  Goal status: INITIAL    LONG TERM GOALS: Target date: 03/05/24  Pt to improve on ODI by 13% to demonstrate statistically significant Improvement in function. (MCID 13-15%) Baseline: 23/50=46% Goal status: INITIAL  2.  Pt will improve strength in all areas listed by 5lbs to demonstrate improved overall physical function Baseline:  Goal status: INITIAL  3.  Pt will be indep with final aquatic HEP for continued management of condition Baseline:  Goal status: INITIAL  4.  Pt will be able to tolerate  Baseline:  Goal status: INITIAL    PLAN:  PT FREQUENCY: 1-2x/week  PT DURATION: 8 weeks  PLANNED INTERVENTIONS: 97110-Therapeutic exercises, 97530- Therapeutic activity, 97112- Neuromuscular re-education, 97535-  Self Care, 02859- Manual therapy, [phone]- Gait training, [phone]- Aquatic Therapy, [phone] (1-2 muscles), [phone] (3+ muscles)- Dry Needling, Patient/Family education, Balance training, Stair training, Taping, Joint mobilization, DME instructions, Cryotherapy, and Moist heat.  PLAN FOR NEXT SESSION: aquatic only: trail for pain management, toleration to movement and strengthening.   Ronal [place]) Garfield Coiner MPT 01/27/2024 11:36 AM [facility] [address] [place], [place], [address] Phone: [phone]   Fax:  [phone]   "

## 2024-01-30 ENCOUNTER — Ambulatory Visit (HOSPITAL_BASED_OUTPATIENT_CLINIC_OR_DEPARTMENT_OTHER): Admitting: Physical Therapy

## 2024-02-02 ENCOUNTER — Encounter (HOSPITAL_BASED_OUTPATIENT_CLINIC_OR_DEPARTMENT_OTHER): Payer: Self-pay | Admitting: Physical Therapy

## 2024-02-02 ENCOUNTER — Ambulatory Visit (HOSPITAL_BASED_OUTPATIENT_CLINIC_OR_DEPARTMENT_OTHER): Payer: Self-pay | Admitting: Physical Therapy

## 2024-02-02 DIAGNOSIS — R2689 Other abnormalities of gait and mobility: Secondary | ICD-10-CM

## 2024-02-02 DIAGNOSIS — M5459 Other low back pain: Secondary | ICD-10-CM

## 2024-02-02 DIAGNOSIS — M6281 Muscle weakness (generalized): Secondary | ICD-10-CM

## 2024-02-02 NOTE — Therapy (Signed)
 " OUTPATIENT PHYSICAL THERAPY THORACOLUMBAR TREATMENT   Patient Name: Tonya Norton MRN: [id number] DOB:1955-03-08, 68 y.o., female Today's Date: 02/02/2024  END OF SESSION:  PT End of Session - 02/02/24 0926     Visit Number 5    Date for Recertification  03/05/24    Authorization Type mcr    Progress Note Due on Visit 10    PT Start Time 0845    PT Stop Time 0925    PT Time Calculation (min) 40 min    Activity Tolerance Patient tolerated treatment well;Patient limited by pain    Behavior During Therapy [facility] for tasks assessed/performed            Past Medical History:  Diagnosis Date   Anxiety    Chronic fatigue syndrome    Closed head injury with concussion    Cystic breast    DDD (degenerative disc disease)    Depression    Family history of adverse reaction to anesthesia    father had N/V under general anesthesia   GERD (gastroesophageal reflux disease)    Heart murmur    HSV (herpes simplex virus) infection    Hypertension    Hypothyroidism    Migraines    Menstrual   Mitral regurgitation    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, myxomatous mitral valve   Mitral regurgitation 12/05/2020   Mitral valve prolapse    Nocturia 07/23/2013   Raynaud's disease    Rosacea    Snoring 07/23/2013   Thyroid  nodule    Past Surgical History:  Procedure Laterality Date   ARTERY BIOPSY Right 04/01/2022   Procedure: RIGHT BIOPSY TEMPORAL ARTERY;  Surgeon: Gretta Lonni PARAS, MD;  Location: [facility] OR;  Service: Vascular;  Laterality: Right;   benign right breast biopsy     BREAST EXCISIONAL BIOPSY Right    DILATION AND CURETTAGE OF UTERUS     laser to cervix     MYOMECTOMY  1986   SHOULDER SURGERY Right    Patient Active Problem List   Diagnosis Date Noted   Right sided temporal headache 03/26/2022   Mitral regurgitation 12/05/2020   Stenosis of left subclavian artery 07/25/2020   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   HTN (hypertension) 05/31/2020   HLD (hyperlipidemia)  05/31/2020   Snoring 07/23/2013   Nocturia 07/23/2013    PCP: Oneil Neth MD REFERRING PROVIDER: Darryle Aver MD  REFERRING DIAG: M53.3 (ICD-10-CM) - Sacrococcygeal disorders, not elsewhere classified   Rationale for Evaluation and Treatment: Rehabilitation  THERAPY DIAG:  Other low back pain  Muscle weakness (generalized)  Other abnormalities of gait and mobility  ONSET DATE: 1 yr  SUBJECTIVE:  SUBJECTIVE STATEMENT: I have started having intermittent but not frequent sharp pelvic/perennial pains. Its a nanosecond but a 10/10. Today pain 4-5/10   Initial Subjective My SIJ is OK. 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   Saw John O'Hallaron  (PT) he believes problem is the cysts. I was unable to tolerate any land based therapy, increased my pain. [facility] to [facility] a specialist in Feb. I have modified my activity to avoid increased pain. Can't bend or lift. Wanted to try aquatic therapy to see if I could exercise there to try to maintain some strength.  Feel like I am getting weak.  PERTINENT HISTORY:  Rt sacral pain and foot paraesthesias  PAIN:  Are you having pain? Yes: NPRS scale: current 3/10; worst 5-6/10; least 1/10 Pain location: Pain in LB with numbness into right foot  Pain description: ache; numbness Aggravating factors: positional depended: walking 10 mins; flex; lifting Relieving factors: lying flat  PRECAUTIONS: None  RED FLAGS: None   WEIGHT BEARING RESTRICTIONS: No  FALLS:  Has patient fallen in last 6 months? No  OCCUPATION: retired pensions consultant  PLOF: Independent  PATIENT GOALS: get some exercises and move  NEXT MD VISIT: Feb  OBJECTIVE:  Note: Objective measures were completed at Evaluation unless otherwise  noted.  DIAGNOSTIC FINDINGS:  7/25 Lumbar MRI FINDINGS: Segmentation:  Standard.   Alignment: Lumbar lordosis is maintained. No significant listhesis.   Vertebrae: No bone marrow edema or evidence of fracture. Vertebral body heights are maintained. No suspicious osseous lesion.   Conus medullaris and cauda equina: Conus extends to the T12-L1 level. Conus and cauda equina appear normal.   Paraspinal and other soft tissues: The paraspinal soft tissues are unremarkable. Prominent Tarlov cyst along the dorsal aspect of the sacrum particularly at the S2 level, largest cyst measures 3.1 x 2.0 x 2.9 cm.   Disc levels:   T12-L1: No significant disc bulge. No significant spinal canal stenosis or foraminal stenosis. Mild facet arthrosis.   L1-2: No significant disc bulge. No significant spinal canal stenosis or foraminal stenosis.   L2-3: No significant disc bulge. Mild-to-moderate facet arthrosis and thickening of the ligamentum flavum. No significant spinal canal or foraminal stenosis.   L3-4: No significant disc bulge. Moderate facet arthrosis and thickening of the ligamentum flavum. No significant spinal canal or foraminal stenosis.   L4-5: Disc desiccation and mild disc height loss. Central/right paracentral annular fissure measuring up to 9 mm in length. Broad-based central disc protrusion which indents the ventral thecal sac. Mild lateral recess narrowing on the left. Moderate facet arthrosis and thickening of the ligamentum flavum. No significant spinal canal or foraminal stenosis.   L5-S1: Disc desiccation and mild-to-moderate disc height loss. Annular fissure extending from the right paracentral region into the left subarticular/foraminal region over length of approximately 14 mm. No significant spinal canal or foraminal stenosis.   IMPRESSION: Degenerative changes as above. No high-grade spinal canal or foraminal stenosis.   Annular fissures at the L4-5 and L5-S1  levels as described above.   Prominent Tarlov cysts.  PATIENT SURVEYS:  ODI:  23/50=46%  COGNITION: Overall cognitive status: Within functional limits for tasks assessed       MUSCLE LENGTH: Hamstrings: tight   POSTURE: decreased lumbar lordosis   LUMBAR ROM:   AROM eval  Flexion Full P!  Extension P!  Right lateral flexion   Left lateral flexion   Right rotation   Left rotation    (  Blank rows = not tested)  LOWER EXTREMITY ROM:     WFL  LOWER EXTREMITY MMT:    MMT Right eval Left eval  Hip flexion 22.2 28.4  Hip extension    Hip abduction 18.6 23.4  Hip adduction    Hip internal rotation    Hip external rotation    Knee flexion    Knee extension 38.6 42.5  Ankle dorsiflexion    Ankle plantarflexion    Ankle inversion    Ankle eversion     (Blank rows = not tested)   FUNCTIONAL TESTS:  Timed up and go (TUG): 9.29   4 stage balance: passed x 4 GAIT: Distance walked: 500 Assistive device utilized: None Level of assistance: Complete Independence Comments: wfl  TREATMENT  [facility] Adult PT Treatment:                                                DATE: 02/02/24 Pt seen for aquatic therapy today.  Treatment took place in water 3.5-4.75 ft in depth at the [facility] pool. Temp of water was 91.  Pt entered/exited the pool via stairs with hand rail.  (Trail hand bells next session)  *walking forward, back and side stepping in 3.6-4.6 ft with unsupported x 5 minutes *using yellow noodle wrapped anteriorly across chest prone suspension ue support corner wall x 3 holding ~20-30 s *return to noodle wrapped anteriorly: hip addct/abct x10; BKTC 2 x 5 *1/2 noodle pull down for TrA engagement wide stance x 5, staggered stance x 5 *ue support noodle 4.4 ft: toe raises; heel raises x 10; hip add/abd x 5; hip flex/ext x 5 *wrapped posteriorly cycling; decompression   Pt requires the buoyancy and hydrostatic pressure of water for support, and to offload  joints by unweighting joint load by at least 50 % in navel deep water and by at least 75-80% in chest to neck deep water.  Viscosity of the water is needed for resistance of strengthening. Water current perturbations provides challenge to standing balance requiring increased core activation.                                                                                                                                PATIENT EDUCATION:  Education details: Discussed eval findings, rehab rationale, aquatic program progression/POC and pools in area. Patient is in agreement  Person educated: Patient Education method: Explanation Education comprehension: verbalized understanding  HOME EXERCISE PROGRAM: Does not tolerate land based ex. Aquatic TBA  ASSESSMENT:  CLINICAL IMPRESSION: Pt reports new pain in pelvic floor area over the w/e. Described as sharp and fleeting, unprovoked. She demonstrates improving core strength with engagement. Subtly increasing load by decreasing submersion with exercises monitoring toleration as we go and allowing for rest periods as needed. Tolerated 40 full minutes of  session.  Progressing well.      Initial Impression Patient is a 68 y.o. f who was seen today for physical therapy evaluation and treatment for SIJ pain. She has had injections without relief.  She has prominent Tarlov cysts in lower lumbar spine into S1 area which she believes to be the source of her pain.  She has an appt with a specialist at [facility] in [place] in Feb to address.  She is moderately pain sensitive in lumbosacral area more prominently on R vs L with numbness in foot after activity in standing > 15 mins.  Lying supine with knees elevated is only position that eliminates pain. Strength deficits in le and core. Pt goal is to be able to exercise maintaining strength as able without excessive pain. She is a good candidate for aquatic intervention and will benefit from the properties of water to  progress towards functional goals.   OBJECTIVE IMPAIRMENTS: decreased activity tolerance, decreased mobility, difficulty walking, decreased strength, and pain.   ACTIVITY LIMITATIONS: carrying, lifting, bending, sitting, standing, transfers, and locomotion level  PARTICIPATION LIMITATIONS: cleaning, laundry, shopping, community activity, and yard work  [employer] POTENTIAL: Good  CLINICAL DECISION MAKING: Stable/uncomplicated  EVALUATION COMPLEXITY: Low   GOALS: Goals reviewed with patient? Yes  SHORT TERM GOALS: Target date: 02/19/23  Pt will tolerate full aquatic sessions consistently without increase in pain and with improving function to demonstrate good toleration and effectiveness of intervention.  Baseline: Goal status: INITIAL  2.  Pt will report decrease in pain by at least 50% for improved toleration to activity/quality of life and to demonstrate improved management of pain. Baseline:  Goal status: INITIAL  3.  Pt will consider gaining pool access for use of the properties of water for chronic conditions maintaining mobility and minimizing pain. Baseline:  Goal status: INITIAL    LONG TERM GOALS: Target date: 03/05/24  Pt to improve on ODI by 13% to demonstrate statistically significant Improvement in function. (MCID 13-15%) Baseline: 23/50=46% Goal status: INITIAL  2.  Pt will improve strength in all areas listed by 5lbs to demonstrate improved overall physical function Baseline:  Goal status: INITIAL  3.  Pt will be indep with final aquatic HEP for continued management of condition Baseline:  Goal status: INITIAL  4.  Pt will be able to tolerate  Baseline:  Goal status: INITIAL    PLAN:  PT FREQUENCY: 1-2x/week  PT DURATION: 8 weeks  PLANNED INTERVENTIONS: 97110-Therapeutic exercises, 97530- Therapeutic activity, 97112- Neuromuscular re-education, 97535- Self Care, 02859- Manual therapy, [phone]- Gait training, [phone]- Aquatic Therapy, [phone] (1-2  muscles), [phone] (3+ muscles)- Dry Needling, Patient/Family education, Balance training, Stair training, Taping, Joint mobilization, DME instructions, Cryotherapy, and Moist heat.  PLAN FOR NEXT SESSION: aquatic only: trail for pain management, toleration to movement and strengthening.   [address] [place]) Tyrease Vandeberg MPT 02/02/2024 9:27 AM [facility] [address] [place], [place], [address] Phone: [phone]   Fax:  [phone]   "

## 2024-02-09 ENCOUNTER — Ambulatory Visit (HOSPITAL_BASED_OUTPATIENT_CLINIC_OR_DEPARTMENT_OTHER): Payer: Self-pay | Attending: Physical Medicine and Rehabilitation | Admitting: Physical Therapy

## 2024-02-09 ENCOUNTER — Encounter (HOSPITAL_BASED_OUTPATIENT_CLINIC_OR_DEPARTMENT_OTHER): Payer: Self-pay | Admitting: Physical Therapy

## 2024-02-09 DIAGNOSIS — R2689 Other abnormalities of gait and mobility: Secondary | ICD-10-CM | POA: Diagnosis present

## 2024-02-09 DIAGNOSIS — M6281 Muscle weakness (generalized): Secondary | ICD-10-CM | POA: Diagnosis present

## 2024-02-09 DIAGNOSIS — M5459 Other low back pain: Secondary | ICD-10-CM | POA: Diagnosis present

## 2024-02-09 NOTE — Therapy (Signed)
 " OUTPATIENT PHYSICAL THERAPY THORACOLUMBAR TREATMENT   Patient Name: Tonya Norton MRN: [id number] DOB:01-31-1956, 69 y.o., female Today's Date: 02/09/2024  END OF SESSION:  PT End of Session - 02/09/24 1100     Visit Number 6    Date for Recertification  03/05/24    Authorization Type mcr    Progress Note Due on Visit 10    PT Start Time 1100    PT Stop Time 1140    PT Time Calculation (min) 40 min    Activity Tolerance Patient tolerated treatment well;Patient limited by pain    Behavior During Therapy [facility] for tasks assessed/performed            Past Medical History:  Diagnosis Date   Anxiety    Chronic fatigue syndrome    Closed head injury with concussion    Cystic breast    DDD (degenerative disc disease)    Depression    Family history of adverse reaction to anesthesia    father had N/V under general anesthesia   GERD (gastroesophageal reflux disease)    Heart murmur    HSV (herpes simplex virus) infection    Hypertension    Hypothyroidism    Migraines    Menstrual   Mitral regurgitation    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, myxomatous mitral valve   Mitral regurgitation 12/05/2020   Mitral valve prolapse    Nocturia 07/23/2013   Raynaud's disease    Rosacea    Snoring 07/23/2013   Thyroid  nodule    Past Surgical History:  Procedure Laterality Date   ARTERY BIOPSY Right 04/01/2022   Procedure: RIGHT BIOPSY TEMPORAL ARTERY;  Surgeon: Gretta Lonni PARAS, MD;  Location: [facility] OR;  Service: Vascular;  Laterality: Right;   benign right breast biopsy     BREAST EXCISIONAL BIOPSY Right    DILATION AND CURETTAGE OF UTERUS     laser to cervix     MYOMECTOMY  1986   SHOULDER SURGERY Right    Patient Active Problem List   Diagnosis Date Noted   Right sided temporal headache 03/26/2022   Mitral regurgitation 12/05/2020   Stenosis of left subclavian artery 07/25/2020   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  Pain today 3-4, maybe 4/10     Initial Subjective My SIJ is OK. 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   Saw John O'Hallaron  (PT) he believes problem is the cysts. I was unable to tolerate any land based therapy, increased my pain. [facility] in [facility]. I have modified my activity to avoid increased pain. Can't bend or lift. Wanted to try aquatic therapy to see if I could exercise there to try to maintain some strength.  Feel like I am getting weak.  PERTINENT HISTORY:  Rt sacral pain and foot paraesthesias  PAIN:  Are you having pain? Yes: NPRS scale: current 3/10; worst 5-6/10; least 1/10 Pain location: Pain in LB with numbness into right foot  Pain description: ache; numbness Aggravating factors: positional depended: walking 10 mins; flex; lifting Relieving factors: lying flat  PRECAUTIONS: None  RED FLAGS: None   WEIGHT BEARING RESTRICTIONS: No  FALLS:  Has patient fallen in last 6 months? No  OCCUPATION: retired pensions consultant  PLOF: Independent  PATIENT GOALS: get some exercises and move  NEXT MD VISIT: Feb  OBJECTIVE:  Note: Objective measures were completed at Evaluation unless otherwise noted.  DIAGNOSTIC FINDINGS:  7/25 Lumbar MRI FINDINGS: Segmentation:   Standard.   Alignment: Lumbar lordosis is maintained. No significant listhesis.   Vertebrae: No bone marrow edema or evidence of fracture. Vertebral body heights are maintained. No suspicious osseous lesion.   Conus medullaris and cauda equina: Conus extends to the T12-L1 level. Conus and cauda equina appear normal.   Paraspinal and other soft tissues: The paraspinal soft tissues are unremarkable. Prominent Tarlov cyst along the dorsal aspect of the sacrum particularly at the S2 level, largest cyst measures 3.1 x 2.0 x 2.9 cm.   Disc levels:   T12-L1: No significant disc bulge. No significant spinal canal stenosis or foraminal stenosis. Mild facet arthrosis.   L1-2: No significant disc bulge. No significant spinal canal stenosis or foraminal stenosis.   L2-3: No significant disc bulge. Mild-to-moderate facet arthrosis and thickening of the ligamentum flavum. No significant spinal canal or foraminal stenosis.   L3-4: No significant disc bulge. Moderate facet arthrosis and thickening of the ligamentum flavum. No significant spinal canal or foraminal stenosis.   L4-5: Disc desiccation and mild disc height loss. Central/right paracentral annular fissure measuring up to 9 mm in length. Broad-based central disc protrusion which indents the ventral thecal sac. Mild lateral recess narrowing on the left. Moderate facet arthrosis and thickening of the ligamentum flavum. No significant spinal canal or foraminal stenosis.   L5-S1: Disc desiccation and mild-to-moderate disc height loss. Annular fissure extending from the right paracentral region into the left subarticular/foraminal region over length of approximately 14 mm. No significant spinal canal or foraminal stenosis.   IMPRESSION: Degenerative changes as above. No high-grade spinal canal or foraminal stenosis.   Annular fissures at the L4-5 and L5-S1 levels as described above.   Prominent Tarlov cysts.  PATIENT SURVEYS:   ODI:  23/50=46%  COGNITION: Overall cognitive status: Within functional limits for tasks assessed       MUSCLE LENGTH: Hamstrings: tight   POSTURE: decreased lumbar lordosis   LUMBAR ROM:   AROM eval  Flexion Full P!  Extension P!  Right lateral flexion   Left lateral flexion   Right rotation   Left rotation    (Blank rows = not  tested)  LOWER EXTREMITY ROM:     WFL  LOWER EXTREMITY MMT:    MMT Right eval Left eval  Hip flexion 22.2 28.4  Hip extension    Hip abduction 18.6 23.4  Hip adduction    Hip internal rotation    Hip external rotation    Knee flexion    Knee extension 38.6 42.5  Ankle dorsiflexion    Ankle plantarflexion    Ankle inversion    Ankle eversion     (Blank rows = not tested)   FUNCTIONAL TESTS:  Timed up and go (TUG): 9.29   4 stage balance: passed x 4 GAIT: Distance walked: 500 Assistive device utilized: None Level of assistance: Complete Independence Comments: wfl  TREATMENT  [facility] Adult PT Treatment:                                                DATE: 02/09/24 Pt seen for aquatic therapy today.  Treatment took place in water 3.5-4.75 ft in depth at the [facility] pool. Temp of water was 91.  Pt entered/exited the pool via stairs with hand rail.   *walking forward, back and side stepping in 3.6-4.6 ft with unsupported x 6 minutes *Green (light) hand bell.  VC and demonstration in wide stance for arm swing. 3 set of slow many reps then fast (to tolerance) x 5. *using yellow noodle wrapped anteriorly across chest prone suspension ue support corner wall x 3 holding ~20-30 s *return to noodle wrapped anteriorly: hip addct/abct x10; BKTC 2 x 5 *1/2 noodle pull down for TrA engagement wide stance x 5, staggered stance x 5 *ue support noodle 4.4 ft: toe raises; heel raises x 10; hip add/abd x 5; hip flex/ext x 5 *wrapped posteriorly cycling; decompression.   Pt requires the buoyancy and hydrostatic pressure of water for  support, and to offload joints by unweighting joint load by at least 50 % in navel deep water and by at least 75-80% in chest to neck deep water.  Viscosity of the water is needed for resistance of strengthening. Water current perturbations provides challenge to standing balance requiring increased core activation.                                                                                                                                PATIENT EDUCATION:  Education details: Discussed eval findings, rehab rationale, aquatic program progression/POC and pools in area. Patient is in agreement  Person educated: Patient Education method: Explanation Education comprehension: verbalized understanding  HOME EXERCISE PROGRAM: Does not tolerate land based ex. Aquatic TBA  ASSESSMENT:  CLINICAL IMPRESSION: Pt continues to report good response to aquatic sessions with very minor increase in pain which reduces quickly. Added (light) hand bell swing for increased core engagement only in wide stance then  followed established routine. This week she begin 2 x weekly sessions. Will continue to monitor toleration. 1 STG met. Goals ongoing.    Initial Impression Patient is a 69 y.o. f who was seen today for physical therapy evaluation and treatment for SIJ pain. She has had injections without relief.  She has prominent Tarlov cysts in lower lumbar spine into S1 area which she believes to be the source of her pain.  She has an appt with a specialist at [facility] in [place] in Feb to address.  She is moderately pain sensitive in lumbosacral area more prominently on R vs L with numbness in foot after activity in standing > 15 mins.  Lying supine with knees elevated is only position that eliminates pain. Strength deficits in le and core. Pt goal is to be able to exercise maintaining strength as able without excessive pain. She is a good candidate for aquatic intervention and will benefit from the properties of water  to progress towards functional goals.   OBJECTIVE IMPAIRMENTS: decreased activity tolerance, decreased mobility, difficulty walking, decreased strength, and pain.   ACTIVITY LIMITATIONS: carrying, lifting, bending, sitting, standing, transfers, and locomotion level  PARTICIPATION LIMITATIONS: cleaning, laundry, shopping, community activity, and yard work  [facility] POTENTIAL: Good  CLINICAL DECISION MAKING: Stable/uncomplicated  EVALUATION COMPLEXITY: Low   GOALS: Goals reviewed with patient? Yes  SHORT TERM GOALS: Target date: 02/19/23  Pt will tolerate full aquatic sessions consistently without increase in pain and with improving function to demonstrate good toleration and effectiveness of intervention.  Baseline: Goal status: Met 02/09/24  2.  Pt will report decrease in pain by at least 50% for improved toleration to activity/quality of life and to demonstrate improved management of pain. Baseline:  Goal status: INITIAL  3.  Pt will consider gaining pool access for use of the properties of water for chronic conditions maintaining mobility and minimizing pain. Baseline:  Goal status: INITIAL    LONG TERM GOALS: Target date: 03/05/24  Pt to improve on ODI by 13% to demonstrate statistically significant Improvement in function. (MCID 13-15%) Baseline: 23/50=46% Goal status: INITIAL  2.  Pt will improve strength in all areas listed by 5lbs to demonstrate improved overall physical function Baseline:  Goal status: INITIAL  3.  Pt will be indep with final aquatic HEP for continued management of condition Baseline:  Goal status: INITIAL  4.  Pt will be able to tolerate  Baseline:  Goal status: INITIAL    PLAN:  PT FREQUENCY: 1-2x/week  PT DURATION: 8 weeks  PLANNED INTERVENTIONS: 97110-Therapeutic exercises, 97530- Therapeutic activity, 97112- Neuromuscular re-education, 97535- Self Care, 02859- Manual therapy, [phone]- Gait training, [phone]- Aquatic Therapy, [phone] (1-2  muscles), [phone] (3+ muscles)- Dry Needling, Patient/Family education, Balance training, Stair training, Taping, Joint mobilization, DME instructions, Cryotherapy, and Moist heat.  PLAN FOR NEXT SESSION: aquatic only: trail for pain management, toleration to movement and strengthening.   [address] [place]) Audianna Landgren MPT 02/09/2024 11:01 AM [facility] [address] [place], [place], [address] Phone: [phone]   Fax:  [phone]   "

## 2024-02-11 ENCOUNTER — Encounter (HOSPITAL_BASED_OUTPATIENT_CLINIC_OR_DEPARTMENT_OTHER): Payer: Self-pay | Admitting: Physical Therapy

## 2024-02-11 ENCOUNTER — Ambulatory Visit (HOSPITAL_BASED_OUTPATIENT_CLINIC_OR_DEPARTMENT_OTHER): Admitting: Physical Therapy

## 2024-02-11 DIAGNOSIS — M6281 Muscle weakness (generalized): Secondary | ICD-10-CM

## 2024-02-11 DIAGNOSIS — R2689 Other abnormalities of gait and mobility: Secondary | ICD-10-CM

## 2024-02-11 DIAGNOSIS — M5459 Other low back pain: Secondary | ICD-10-CM | POA: Diagnosis not present

## 2024-02-11 NOTE — Therapy (Signed)
 " OUTPATIENT PHYSICAL THERAPY THORACOLUMBAR TREATMENT   Patient Name: Tonya Norton MRN: [id number] DOB:1955/02/22, 69 y.o., female Today's Date: 02/11/2024  END OF SESSION:  PT End of Session - 02/11/24 1010     Visit Number 7    Date for Recertification  03/05/24    Authorization Type mcr    Progress Note Due on Visit 10    PT Start Time 0930    PT Stop Time 1000    PT Time Calculation (min) 30 min    Activity Tolerance Patient tolerated treatment well;Patient limited by pain    Behavior During Therapy [facility] for tasks assessed/performed             Past Medical History:  Diagnosis Date   Anxiety    Chronic fatigue syndrome    Closed head injury with concussion    Cystic breast    DDD (degenerative disc disease)    Depression    Family history of adverse reaction to anesthesia    father had N/V under general anesthesia   GERD (gastroesophageal reflux disease)    Heart murmur    HSV (herpes simplex virus) infection    Hypertension    Hypothyroidism    Migraines    Menstrual   Mitral regurgitation    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, myxomatous mitral valve   Mitral regurgitation 12/05/2020   Mitral valve prolapse    Nocturia 07/23/2013   Raynaud's disease    Rosacea    Snoring 07/23/2013   Thyroid  nodule    Past Surgical History:  Procedure Laterality Date   ARTERY BIOPSY Right 04/01/2022   Procedure: RIGHT BIOPSY TEMPORAL ARTERY;  Surgeon: Gretta Lonni PARAS, MD;  Location: [facility] OR;  Service: Vascular;  Laterality: Right;   benign right breast biopsy     BREAST EXCISIONAL BIOPSY Right    DILATION AND CURETTAGE OF UTERUS     laser to cervix     MYOMECTOMY  1986   SHOULDER SURGERY Right    Patient Active Problem List   Diagnosis Date Noted   Right sided temporal headache 03/26/2022   Mitral regurgitation 12/05/2020   Stenosis of left subclavian artery 07/25/2020   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   HTN (hypertension) 05/31/2020   HLD (hyperlipidemia)  05/31/2020   Snoring 07/23/2013   Nocturia 07/23/2013    PCP: Oneil Neth MD REFERRING PROVIDER: Darryle Aver MD  REFERRING DIAG: M53.3 (ICD-10-CM) - Sacrococcygeal disorders, not elsewhere classified   Rationale for Evaluation and Treatment: Rehabilitation  THERAPY DIAG:  Other low back pain  Muscle weakness (generalized)  Other abnormalities of gait and mobility  ONSET DATE: 1 yr  SUBJECTIVE:  SUBJECTIVE STATEMENT: Pain 4/10.  Just some tiredness after last session     Initial Subjective My SIJ is OK. 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   Saw John O'Hallaron  (PT) he believes problem is the cysts. I was unable to tolerate any land based therapy, increased my pain. [facility] a specialist in Feb. I have modified my activity to avoid increased pain. Can't bend or lift. Wanted to try aquatic therapy to see if I could exercise there to try to maintain some strength.  Feel like I am getting weak.  PERTINENT HISTORY:  Rt sacral pain and foot paraesthesias  PAIN:  Are you having pain? Yes: NPRS scale: current 3/10; worst 5-6/10; least 1/10 Pain location: Pain in LB with numbness into right foot  Pain description: ache; numbness Aggravating factors: positional depended: walking 10 mins; flex; lifting Relieving factors: lying flat  PRECAUTIONS: None  RED FLAGS: None   WEIGHT BEARING RESTRICTIONS: No  FALLS:  Has patient fallen in last 6 months? No  OCCUPATION: retired pensions consultant  PLOF: Independent  PATIENT GOALS: get some exercises and move  NEXT MD VISIT: Feb  OBJECTIVE:  Note: Objective measures were completed at Evaluation unless otherwise noted.  DIAGNOSTIC FINDINGS:  7/25 Lumbar MRI FINDINGS: Segmentation:   Standard.   Alignment: Lumbar lordosis is maintained. No significant listhesis.   Vertebrae: No bone marrow edema or evidence of fracture. Vertebral body heights are maintained. No suspicious osseous lesion.   Conus medullaris and cauda equina: Conus extends to the T12-L1 level. Conus and cauda equina appear normal.   Paraspinal and other soft tissues: The paraspinal soft tissues are unremarkable. Prominent Tarlov cyst along the dorsal aspect of the sacrum particularly at the S2 level, largest cyst measures 3.1 x 2.0 x 2.9 cm.   Disc levels:   T12-L1: No significant disc bulge. No significant spinal canal stenosis or foraminal stenosis. Mild facet arthrosis.   L1-2: No significant disc bulge. No significant spinal canal stenosis or foraminal stenosis.   L2-3: No significant disc bulge. Mild-to-moderate facet arthrosis and thickening of the ligamentum flavum. No significant spinal canal or foraminal stenosis.   L3-4: No significant disc bulge. Moderate facet arthrosis and thickening of the ligamentum flavum. No significant spinal canal or foraminal stenosis.   L4-5: Disc desiccation and mild disc height loss. Central/right paracentral annular fissure measuring up to 9 mm in length. Broad-based central disc protrusion which indents the ventral thecal sac. Mild lateral recess narrowing on the left. Moderate facet arthrosis and thickening of the ligamentum flavum. No significant spinal canal or foraminal stenosis.   L5-S1: Disc desiccation and mild-to-moderate disc height loss. Annular fissure extending from the right paracentral region into the left subarticular/foraminal region over length of approximately 14 mm. No significant spinal canal or foraminal stenosis.   IMPRESSION: Degenerative changes as above. No high-grade spinal canal or foraminal stenosis.   Annular fissures at the L4-5 and L5-S1 levels as described above.   Prominent Tarlov cysts.  PATIENT SURVEYS:   ODI:  23/50=46%  COGNITION: Overall cognitive status: Within functional limits for tasks assessed       MUSCLE LENGTH: Hamstrings: tight   POSTURE: decreased lumbar lordosis   LUMBAR ROM:   AROM eval  Flexion Full P!  Extension P!  Right lateral flexion   Left lateral flexion   Right rotation   Left rotation    (Blank rows = not tested)  LOWER  EXTREMITY ROM:     WFL  LOWER EXTREMITY MMT:    MMT Right eval Left eval  Hip flexion 22.2 28.4  Hip extension    Hip abduction 18.6 23.4  Hip adduction    Hip internal rotation    Hip external rotation    Knee flexion    Knee extension 38.6 42.5  Ankle dorsiflexion    Ankle plantarflexion    Ankle inversion    Ankle eversion     (Blank rows = not tested)   FUNCTIONAL TESTS:  Timed up and go (TUG): 9.29   4 stage balance: passed x 4 GAIT: Distance walked: 500 Assistive device utilized: None Level of assistance: Complete Independence Comments: wfl  TREATMENT  [facility] Adult PT Treatment:                                                DATE: 02/11/24 Pt seen for aquatic therapy today.  Treatment took place in water 3.5-4.75 ft in depth at the [facility] pool. Temp of water was 91.  Pt entered/exited the pool via stairs with hand rail.   *walking forward, back and side stepping in 3.6-4.6 ft with unsupported x 7 minutes. Added RBHB horizontal abd/add then green HB row and add/abd *Green (light) hand bell.  VC and demonstration in staggered stance for arm swing. Tolerates 1 set of 5 slow/5 fast. *decompression with noodle wrapped posteriorly: cycling. To relieve pain *ue support noodle 4.4 ft: toe raises; heel raises x 10; hip add/abd x 5 crossing midline *using yellow noodle wrapped anteriorly across chest prone suspension ue support corner wall x 3 holding ~20-30 s Multiple rest periods throughout session in squatted position  Pt requires the buoyancy and hydrostatic pressure of water for support, and to  offload joints by unweighting joint load by at least 50 % in navel deep water and by at least 75-80% in chest to neck deep water.  Viscosity of the water is needed for resistance of strengthening. Water current perturbations provides challenge to standing balance requiring increased core activation.                                                                                                                                PATIENT EDUCATION:  Education details: Discussed eval findings, rehab rationale, aquatic program progression/POC and pools in area. Patient is in agreement  Person educated: Patient Education method: Explanation Education comprehension: verbalized understanding  HOME EXERCISE PROGRAM: Does not tolerate land based ex. Aquatic TBA  ASSESSMENT:  CLINICAL IMPRESSION: Pt reports just some tiredness after last session. Today she is a little more uncomfortable throughout session requiring multiple positional changes and reduction in time engaging in exercise with frequent rest periods.  She decides with PT agreement to cut session short. She will continue to benefit  from skilled PT and will progress, albeit slowly towards goals set     Initial Impression Patient is a 69 y.o. f who was seen today for physical therapy evaluation and treatment for SIJ pain. She has had injections without relief.  She has prominent Tarlov cysts in lower lumbar spine into S1 area which she believes to be the source of her pain.  She has an appt with a specialist at [facility] in [place] in Feb to address.  She is moderately pain sensitive in lumbosacral area more prominently on R vs L with numbness in foot after activity in standing > 15 mins.  Lying supine with knees elevated is only position that eliminates pain. Strength deficits in le and core. Pt goal is to be able to exercise maintaining strength as able without excessive pain. She is a good candidate for aquatic intervention and will benefit from  the properties of water to progress towards functional goals.   OBJECTIVE IMPAIRMENTS: decreased activity tolerance, decreased mobility, difficulty walking, decreased strength, and pain.   ACTIVITY LIMITATIONS: carrying, lifting, bending, sitting, standing, transfers, and locomotion level  PARTICIPATION LIMITATIONS: cleaning, laundry, shopping, community activity, and yard work  [facility] [facility] POTENTIAL: Good  CLINICAL DECISION MAKING: Stable/uncomplicated  EVALUATION COMPLEXITY: Low   GOALS: Goals reviewed with patient? Yes  SHORT TERM GOALS: Target date: 02/19/23  Pt will tolerate full aquatic sessions consistently without increase in pain and with improving function to demonstrate good toleration and effectiveness of intervention.  Baseline: Goal status: Met 02/09/24  2.  Pt will report decrease in pain by at least 50% for improved toleration to activity/quality of life and to demonstrate improved management of pain. Baseline:  Goal status: INITIAL  3.  Pt will consider gaining pool access for use of the properties of water for chronic conditions maintaining mobility and minimizing pain. Baseline:  Goal status: In progress 02/11/24    LONG TERM GOALS: Target date: 03/05/24  Pt to improve on ODI by 13% to demonstrate statistically significant Improvement in function. (MCID 13-15%) Baseline: 23/50=46% Goal status: INITIAL  2.  Pt will improve strength in all areas listed by 5lbs to demonstrate improved overall physical function Baseline:  Goal status: INITIAL  3.  Pt will be indep with final aquatic HEP for continued management of condition Baseline:  Goal status: INITIAL  4.  Pt will be able to tolerate  Baseline:  Goal status: INITIAL    PLAN:  PT FREQUENCY: 1-2x/week  PT DURATION: 8 weeks  PLANNED INTERVENTIONS: 97110-Therapeutic exercises, 97530- Therapeutic activity, 97112- Neuromuscular re-education, 97535- Self Care, 02859- Manual therapy, [phone]- Gait training,  [phone]- Aquatic Therapy, [phone] (1-2 muscles), [phone] (3+ muscles)- Dry Needling, Patient/Family education, Balance training, Stair training, Taping, Joint mobilization, DME instructions, Cryotherapy, and Moist heat.  PLAN FOR NEXT SESSION: aquatic only: trail for pain management, toleration to movement and strengthening.   Ronal [place]) Tanmay Halteman MPT 02/11/2024 10:11 AM [facility] [address] [place], [place], [address] Phone: [phone]   Fax:  [phone]   "

## 2024-02-16 ENCOUNTER — Ambulatory Visit (HOSPITAL_BASED_OUTPATIENT_CLINIC_OR_DEPARTMENT_OTHER): Admitting: Physical Therapy

## 2024-02-16 ENCOUNTER — Encounter (HOSPITAL_BASED_OUTPATIENT_CLINIC_OR_DEPARTMENT_OTHER): Payer: Self-pay | Admitting: Physical Therapy

## 2024-02-16 DIAGNOSIS — R2689 Other abnormalities of gait and mobility: Secondary | ICD-10-CM

## 2024-02-16 DIAGNOSIS — M5459 Other low back pain: Secondary | ICD-10-CM

## 2024-02-16 DIAGNOSIS — M6281 Muscle weakness (generalized): Secondary | ICD-10-CM

## 2024-02-16 NOTE — Therapy (Signed)
 " OUTPATIENT PHYSICAL THERAPY THORACOLUMBAR TREATMENT   Patient Name: Tonya Norton MRN: [id number] DOB:[birth year], 69 y.o., female Today's Date: 02/16/2024  END OF SESSION:  PT End of Session - 02/16/24 1019     Visit Number 8    Date for Recertification  03/05/24    Authorization Type mcr    Progress Note Due on Visit 10    PT Start Time 1016    PT Stop Time 1054    PT Time Calculation (min) 38 min    Activity Tolerance Patient tolerated treatment well;Patient limited by pain    Behavior During Therapy WFL for tasks assessed/performed             Past Medical History:  Diagnosis Date   Anxiety    Chronic fatigue syndrome    Closed head injury with concussion    Cystic breast    DDD (degenerative disc disease)    Depression    Family history of adverse reaction to anesthesia    father had N/V under general anesthesia   GERD (gastroesophageal reflux disease)    Heart murmur    HSV (herpes simplex virus) infection    Hypertension    Hypothyroidism    Migraines    Menstrual   Mitral regurgitation    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, myxomatous mitral valve   Mitral regurgitation 12/05/2020   Mitral valve prolapse    Nocturia 07/23/2013   Raynaud's disease    Rosacea    Snoring 07/23/2013   Thyroid  nodule    Past Surgical History:  Procedure Laterality Date   ARTERY BIOPSY Right 04/01/2022   Procedure: RIGHT BIOPSY TEMPORAL ARTERY;  Surgeon: Gretta Lonni PARAS, MD;  Location: [facility] OR;  Service: Vascular;  Laterality: Right;   benign right breast biopsy     BREAST EXCISIONAL BIOPSY Right    DILATION AND CURETTAGE OF UTERUS     laser to cervix     MYOMECTOMY  1986   SHOULDER SURGERY Right    Patient Active Problem List   Diagnosis Date Noted   Right sided temporal headache 03/26/2022   Mitral regurgitation 12/05/2020   Stenosis of left subclavian artery 07/25/2020   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  Would like to return to the movement exercises we were doing a few sessions ago.  Pain today 3-4/10.     Initial Subjective My SIJ is OK. 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   Saw John O'Hallaron  (PT) he believes problem is the cysts. I was unable to tolerate any land based therapy, increased my pain. I have [facility] app[facility] to [facility] a specialist in Feb. I have modified my activity to avoid increased pain. Can't bend or lift. Wanted to try aquatic therapy to see if I could exercise there to try to maintain some strength.  Feel like I am getting weak.  PERTINENT HISTORY:  Rt sacral pain and foot paraesthesias  PAIN:  Are you having pain? Yes: NPRS scale: current 3/10; worst 5-6/10; least 1/10 Pain location: Pain in LB with numbness into right foot  Pain description: ache; numbness Aggravating factors: positional depended: walking 10 mins; flex; lifting Relieving factors: lying flat  PRECAUTIONS: None  RED FLAGS: None   WEIGHT BEARING RESTRICTIONS: No  FALLS:  Has patient fallen in last 6 months? No  OCCUPATION: retired pensions consultant  PLOF: Independent  PATIENT GOALS: get some exercises and move  NEXT MD VISIT: Feb  OBJECTIVE:  Note: Objective measures were  completed at Evaluation unless otherwise noted.  DIAGNOSTIC FINDINGS:  7/25 Lumbar MRI FINDINGS: Segmentation:  Standard.   Alignment: Lumbar lordosis is maintained. No significant listhesis.   Vertebrae: No bone marrow edema or evidence of fracture. Vertebral body heights are maintained. No suspicious osseous lesion.   Conus medullaris and cauda equina: Conus extends to the T12-L1 level. Conus and cauda equina appear normal.   Paraspinal and other soft tissues: The paraspinal soft tissues are unremarkable. Prominent Tarlov cyst along the dorsal aspect of the sacrum particularly at the S2 level, largest cyst measures 3.1 x 2.0 x 2.9 cm.   Disc levels:   T12-L1: No significant disc bulge. No significant spinal canal stenosis or foraminal stenosis. Mild facet arthrosis.   L1-2: No significant disc bulge. No significant spinal canal stenosis or foraminal stenosis.   L2-3: No significant disc bulge. Mild-to-moderate facet arthrosis and thickening of the ligamentum flavum. No significant spinal canal or foraminal stenosis.   L3-4: No significant disc bulge. Moderate facet arthrosis and thickening of the ligamentum flavum. No significant spinal canal or foraminal stenosis.   L4-5: Disc desiccation and mild disc height loss. Central/right paracentral annular fissure measuring up to 9 mm in length. Broad-based central disc protrusion which indents the ventral thecal sac. Mild lateral recess narrowing on the left. Moderate facet arthrosis and thickening of the ligamentum flavum. No significant spinal canal or foraminal stenosis.   L5-S1: Disc desiccation and mild-to-moderate disc height loss. Annular fissure extending from the right paracentral region into the left subarticular/foraminal region over length of approximately 14 mm. No significant spinal canal or foraminal stenosis.   IMPRESSION: Degenerative changes as above. No high-grade spinal canal or foraminal stenosis.    Annular fissures at the L4-5 and L5-S1 levels as described above.   Prominent Tarlov cysts.  PATIENT SURVEYS:  ODI:  23/50=46%  COGNITION: Overall cognitive status: Within functional limits for tasks assessed       MUSCLE LENGTH: Hamstrings: tight   POSTURE: decreased lumbar lordosis   LUMBAR ROM:   AROM eval  Flexion Full P!  Extension P!  Right lateral flexion   Left  lateral flexion   Right rotation   Left rotation    (Blank rows = not tested)  LOWER EXTREMITY ROM:     WFL  LOWER EXTREMITY MMT:    MMT Right eval Left eval  Hip flexion 22.2 28.4  Hip extension    Hip abduction 18.6 23.4  Hip adduction    Hip internal rotation    Hip external rotation    Knee flexion    Knee extension 38.6 42.5  Ankle dorsiflexion    Ankle plantarflexion    Ankle inversion    Ankle eversion     (Blank rows = not tested)   FUNCTIONAL TESTS:  Timed up and go (TUG): 9.29   4 stage balance: passed x 4 GAIT: Distance walked: 500 Assistive device utilized: None Level of assistance: Complete Independence Comments: wfl  TREATMENT  [facility] Adult PT Treatment:                                                DATE: 02/11/24 Pt seen for aquatic therapy today.  Treatment took place in water 3.5-4.75 ft in depth at the [facility] pool. Temp of water was 91.  Pt entered/exited the pool via stairs with hand rail.  *walking forward, back and side stepping in 3.6-4.6 ft with unsupported x 5 minutes *using yellow noodle wrapped anteriorly across chest prone suspension ue support corner wall x 3 holding ~20-30 s: BKTC 2 x5; hip add/abd 2 x 5 *noodle wrapped anteriorly:cycling *ue support noodle 4.4 ft: toe raises; heel raises x 10; *decompression on noodle *Standing ue support wall:  hip add/abd x 5; hip flex/ext x 5   Pt requires the buoyancy and hydrostatic pressure of water for support, and to offload joints by unweighting joint load by at least 50 % in navel deep water  and by at least 75-80% in chest to neck deep water.  Viscosity of the water is needed for resistance of strengthening. Water current perturbations provides challenge to standing balance requiring increased core activation.                                                                                                                                PATIENT EDUCATION:  Education details: Discussed eval findings, rehab rationale, aquatic program progression/POC and pools in area. Patient is in agreement  Person educated: Patient Education method: Explanation Education comprehension: verbalized understanding  HOME EXERCISE PROGRAM: Does not tolerate land based ex. Aquatic TBA  ASSESSMENT:  CLINICAL IMPRESSION: Dialed back session today due to gradual increase over last 2 treatments in back pain likely caused by slowly increasing intensity of sessions.  We eliminated core engagement using hand bells.  Session toleration was good. Pt able to complete full treatment with multiple rest period given as needed. Overall pain reduction not accomplished  as of yet but pt is benefiting  from increased movement and slow increase in strength/toleration to activity.      Initial Impression Patient is a 69 y.o. f who was seen today for physical therapy evaluation and treatment for SIJ pain. She has had injections without relief.  She has prominent Tarlov cysts in lower lumbar spine into S1 area which she believes to be the source of her pain.  She has an appt with a specialist at [facility] in [place] in Feb to address.  She is moderately pain sensitive in lumbosacral area more prominently on R vs L with numbness in foot after activity in standing > 15 mins.  Lying supine with knees elevated is only position that eliminates pain. Strength deficits in le and core. Pt goal is to be able to exercise maintaining strength as able without excessive pain. She is a good candidate for aquatic intervention and will benefit  from the properties of water to progress towards functional goals.   OBJECTIVE IMPAIRMENTS: decreased activity tolerance, decreased mobility, difficulty walking, decreased strength, and pain.   ACTIVITY LIMITATIONS: carrying, lifting, bending, sitting, standing, transfers, and locomotion level  PARTICIPATION LIMITATIONS: cleaning, laundry, shopping, community activity, and yard work  [facility] POTENTIAL: Good  CLINICAL DECISION MAKING: Stable/uncomplicated  EVALUATION COMPLEXITY: Low   GOALS: Goals reviewed with patient? Yes  SHORT TERM GOALS: Target date: 02/19/23  Pt will tolerate full aquatic sessions consistently without increase in pain and with improving function to demonstrate good toleration and effectiveness of intervention.  Baseline: Goal status: Met 02/09/24  2.  Pt will report decrease in pain by at least 50% for improved toleration to activity/quality of life and to demonstrate improved management of pain. Baseline:  Goal status: In progress 02/16/24  3.  Pt will consider gaining pool access for use of the properties of water for chronic conditions maintaining mobility and minimizing pain. Baseline:  Goal status: In progress 02/11/24    LONG TERM GOALS: Target date: 03/05/24  Pt to improve on ODI by 13% to demonstrate statistically significant Improvement in function. (MCID 13-15%) Baseline: 23/50=46% Goal status: INITIAL  2.  Pt will improve strength in all areas listed by 5lbs to demonstrate improved overall physical function Baseline:  Goal status: INITIAL  3.  Pt will be indep with final aquatic HEP for continued management of condition Baseline:  Goal status: INITIAL  4.  Pt will be able to tolerate  Baseline:  Goal status: INITIAL    PLAN:  PT FREQUENCY: 1-2x/week  PT DURATION: 8 weeks  PLANNED INTERVENTIONS: 97110-Therapeutic exercises, 97530- Therapeutic activity, 97112- Neuromuscular re-education, 97535- Self Care, 02859- Manual therapy, [phone]-  Gait training, [phone]- Aquatic Therapy, [phone] (1-2 muscles), [id number] (3+ muscles)- Dry Needling, Patient/Family education, Balance training, Stair training, Taping, Joint mobilization, DME instructions, Cryotherapy, and Moist heat.  PLAN FOR NEXT SESSION: aquatic only: trail for pain management, toleration to movement and strengthening.   Ronal Lyford) Duell Holdren MPT 02/16/2024 11:03 AM [facility] [address] [place], [place], [address] Phone: [phone]   Fax:  [phone]   "

## 2024-02-19 ENCOUNTER — Ambulatory Visit (HOSPITAL_BASED_OUTPATIENT_CLINIC_OR_DEPARTMENT_OTHER): Admitting: Physical Therapy

## 2024-02-19 ENCOUNTER — Encounter (HOSPITAL_BASED_OUTPATIENT_CLINIC_OR_DEPARTMENT_OTHER): Payer: Self-pay | Admitting: Physical Therapy

## 2024-02-19 DIAGNOSIS — M5459 Other low back pain: Secondary | ICD-10-CM

## 2024-02-19 DIAGNOSIS — M6281 Muscle weakness (generalized): Secondary | ICD-10-CM

## 2024-02-19 DIAGNOSIS — R2689 Other abnormalities of gait and mobility: Secondary | ICD-10-CM

## 2024-02-19 NOTE — Therapy (Signed)
 " OUTPATIENT PHYSICAL THERAPY THORACOLUMBAR TREATMENT   Patient Name: Tonya Norton MRN: [id number] DOB:July 30, 1955, 69 y.o., female Today's Date: 02/19/2024  END OF SESSION:  PT End of Session - 02/19/24 1035     Visit Number 9    Date for Recertification  03/05/24    Authorization Type mcr    Progress Note Due on Visit 10    PT Start Time 1015    PT Stop Time 1053    PT Time Calculation (min) 38 min    Activity Tolerance Patient tolerated treatment well;Patient limited by pain    Behavior During Therapy WFL for tasks assessed/performed              Past Medical History:  Diagnosis Date   Anxiety    Chronic fatigue syndrome    Closed head injury with concussion    Cystic breast    DDD (degenerative disc disease)    Depression    Family history of adverse reaction to anesthesia    father had N/V under general anesthesia   GERD (gastroesophageal reflux disease)    Heart murmur    HSV (herpes simplex virus) infection    Hypertension    Hypothyroidism    Migraines    Menstrual   Mitral regurgitation    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, myxomatous mitral valve   Mitral regurgitation 12/05/2020   Mitral valve prolapse    Nocturia 07/23/2013   Raynaud's disease    Rosacea    Snoring 07/23/2013   Thyroid  nodule    Past Surgical History:  Procedure Laterality Date   ARTERY BIOPSY Right 04/01/2022   Procedure: RIGHT BIOPSY TEMPORAL ARTERY;  Surgeon: Gretta Lonni PARAS, MD;  Location: [facility] OR;  Service: Vascular;  Laterality: Right;   benign right breast biopsy     BREAST EXCISIONAL BIOPSY Right    DILATION AND CURETTAGE OF UTERUS     laser to cervix     MYOMECTOMY  1986   SHOULDER SURGERY Right    Patient Active Problem List   Diagnosis Date Noted   Right sided temporal headache 03/26/2022   Mitral regurgitation 12/05/2020   Stenosis of left subclavian artery 07/25/2020   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   HTN (hypertension) 05/31/2020   HLD  (hyperlipidemia) 05/31/2020   Snoring 07/23/2013   Nocturia 07/23/2013    PCP: Oneil Neth MD REFERRING PROVIDER: Darryle Aver MD  REFERRING DIAG: M53.3 (ICD-10-CM) - Sacrococcygeal disorders, not elsewhere classified   Rationale for Evaluation and Treatment: Rehabilitation  THERAPY DIAG:  Other low back pain  Muscle weakness (generalized)  Other abnormalities of gait and mobility  ONSET DATE: 1 yr  SUBJECTIVE:  SUBJECTIVE STATEMENT: Felt much better after last session reducing what we had done form the 2 sessions prior. Pain today 3/10     Initial Subjective My SIJ is OK. 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   Saw John O'Hallaron  (PT) he believes problem is the cysts. I was unable to tolerate any land based therapy, increased my pain. I have an appt at [facility] to see a specialist in Feb. I have modified my activity to avoid increased pain. Can't bend or lift. Wanted to try aquatic therapy to see if I could exercise there to try to maintain some strength.  Feel like I am getting weak.  PERTINENT HISTORY:  Rt sacral pain and foot paraesthesias  PAIN:  Are you having pain? Yes: NPRS scale: current 3/10; worst 5-6/10; least 1/10 Pain location: Pain in LB with numbness into right foot  Pain description: ache; numbness Aggravating factors: positional depended: walking 10 mins; flex; lifting Relieving factors: lying flat  PRECAUTIONS: None  RED FLAGS: None   WEIGHT BEARING RESTRICTIONS: No  FALLS:  Has patient fallen in last 6 months? No  OCCUPATION: retired pensions consultant  PLOF: Independent  PATIENT GOALS: get some exercises and move  NEXT MD VISIT: Feb  OBJECTIVE:  Note: Objective measures were completed at Evaluation unless otherwise  noted.  DIAGNOSTIC FINDINGS:  7/25 Lumbar MRI FINDINGS: Segmentation:  Standard.   Alignment: Lumbar lordosis is maintained. No significant listhesis.   Vertebrae: No bone marrow edema or evidence of fracture. Vertebral body heights are maintained. No suspicious osseous lesion.   Conus medullaris and cauda equina: Conus extends to the T12-L1 level. Conus and cauda equina appear normal.   Paraspinal and other soft tissues: The paraspinal soft tissues are unremarkable. Prominent Tarlov cyst along the dorsal aspect of the sacrum particularly at the S2 level, largest cyst measures 3.1 x 2.0 x 2.9 cm.   Disc levels:   T12-L1: No significant disc bulge. No significant spinal canal stenosis or foraminal stenosis. Mild facet arthrosis.   L1-2: No significant disc bulge. No significant spinal canal stenosis or foraminal stenosis.   L2-3: No significant disc bulge. Mild-to-moderate facet arthrosis and thickening of the ligamentum flavum. No significant spinal canal or foraminal stenosis.   L3-4: No significant disc bulge. Moderate facet arthrosis and thickening of the ligamentum flavum. No significant spinal canal or foraminal stenosis.   L4-5: Disc desiccation and mild disc height loss. Central/right paracentral annular fissure measuring up to 9 mm in length. Broad-based central disc protrusion which indents the ventral thecal sac. Mild lateral recess narrowing on the left. Moderate facet arthrosis and thickening of the ligamentum flavum. No significant spinal canal or foraminal stenosis.   L5-S1: Disc desiccation and mild-to-moderate disc height loss. Annular fissure extending from the right paracentral region into the left subarticular/foraminal region over length of approximately 14 mm. No significant spinal canal or foraminal stenosis.   IMPRESSION: Degenerative changes as above. No high-grade spinal canal or foraminal stenosis.   Annular fissures at the L4-5 and L5-S1  levels as described above.   Prominent Tarlov cysts.  PATIENT SURVEYS:  ODI:  23/50=46%  COGNITION: Overall cognitive status: Within functional limits for tasks assessed       MUSCLE LENGTH: Hamstrings: tight   POSTURE: decreased lumbar lordosis   LUMBAR ROM:   AROM eval  Flexion Full P!  Extension P!  Right lateral flexion   Left lateral flexion   Right rotation   Left rotation    (  Blank rows = not tested)  LOWER EXTREMITY ROM:     WFL  LOWER EXTREMITY MMT:    MMT Right eval Left eval  Hip flexion 22.2 28.4  Hip extension    Hip abduction 18.6 23.4  Hip adduction    Hip internal rotation    Hip external rotation    Knee flexion    Knee extension 38.6 42.5  Ankle dorsiflexion    Ankle plantarflexion    Ankle inversion    Ankle eversion     (Blank rows = not tested)   FUNCTIONAL TESTS:  Timed up and go (TUG): 9.29   4 stage balance: passed x 4 GAIT: Distance walked: 500 Assistive device utilized: None Level of assistance: Complete Independence Comments: wfl  TREATMENT  [facility] Adult PT Treatment:                                                DATE: 02/19/24 Pt seen for aquatic therapy today.  Treatment took place in water 3.5-4.75 ft in depth at the [facility] pool. Temp of water was 91.  Pt entered/exited the pool via stairs with hand rail.  *walking forward, back and side stepping in 3.6-4.6 ft with unsupported x 5 minutes *using yellow noodle wrapped anteriorly across chest prone suspension ue support corner wall x 3 holding ~20-30 s: BKTC 2 x5; hip add/abd 2 x 5 *ue support wall 4.4 ft: toe raises; heel raises x 10; hip add/abd/flex/ext 3 x 5; marching/HS curls 2 x 5 *decompression on noodle *noodle wrapped anteriorly:cycling; breast strike motion ue for treading water using noodle *Standing ue support wall:  hip add/abd x 5; hip flex/ext x 5   Pt requires the buoyancy and hydrostatic pressure of water for support, and to offload  joints by unweighting joint load by at least 50 % in navel deep water and by at least 75-80% in chest to neck deep water.  Viscosity of the water is needed for resistance of strengthening. Water current perturbations provides challenge to standing balance requiring increased core activation.                                                                                                                                PATIENT EDUCATION:  Education details: Discussed eval findings, rehab rationale, aquatic program progression/POC and pools in area. Patient is in agreement  Person educated: Patient Education method: Explanation Education comprehension: verbalized understanding  HOME EXERCISE PROGRAM: Does not tolerate land based ex. Aquatic TBA  ASSESSMENT:  CLINICAL IMPRESSION: I do believe we have established pt limits with exercise/core engagement. She tolerated last session well after a process of slowly increasing then reducing/modifying exercises once pt had increased intolerance to session. Pt is in agreement. Plan to work at establishing aquatic HEP varying exercises with mindfulness of  degree of core engagement/ axial load for maximal toleration and improvement in function.  She sees a specialist at [facility] 2/11 who specializes in Tarlov cyst. Will plan to complete PN and re-cert for a few session to follow up with pt pt MD visit.        Initial Impression Patient is a 69 y.o. f who was seen today for physical therapy evaluation and treatment for SIJ pain. She has had injections without relief.  She has prominent Tarlov cysts in lower lumbar spine into S1 area which she believes to be the source of her pain.  She has an appt with a specialist at [facility] in [place] in Feb to address.  She is moderately pain sensitive in lumbosacral area more prominently on R vs L with numbness in foot after activity in standing > 15 mins.  Lying supine with knees elevated is only position that  eliminates pain. Strength deficits in le and core. Pt goal is to be able to exercise maintaining strength as able without excessive pain. She is a good candidate for aquatic intervention and will benefit from the properties of water to progress towards functional goals.   OBJECTIVE IMPAIRMENTS: decreased activity tolerance, decreased mobility, difficulty walking, decreased strength, and pain.   ACTIVITY LIMITATIONS: carrying, lifting, bending, sitting, standing, transfers, and locomotion level  PARTICIPATION LIMITATIONS: cleaning, laundry, shopping, community activity, and yard work  [facility] POTENTIAL: Good  CLINICAL DECISION MAKING: Stable/uncomplicated  EVALUATION COMPLEXITY: Low   GOALS: Goals reviewed with patient? Yes  SHORT TERM GOALS: Target date: 02/19/24  Pt will tolerate full aquatic sessions consistently without increase in pain and with improving function to demonstrate good toleration and effectiveness of intervention.  Baseline: Goal status: Met 02/09/24  2.  Pt will report decrease in pain by at least 50% for improved toleration to activity/quality of life and to demonstrate improved management of pain. Baseline:  Goal status: In progress 02/16/24; 02/19/24  3.  Pt will consider gaining pool access for use of the properties of water for chronic conditions maintaining mobility and minimizing pain. Baseline:  Goal status: In progress 02/11/24; 02/19/24    LONG TERM GOALS: Target date: 03/05/24  Pt to improve on ODI by 13% to demonstrate statistically significant Improvement in function. (MCID 13-15%) Baseline: 23/50=46% Goal status: INITIAL  2.  Pt will improve strength in all areas listed by 5lbs to demonstrate improved overall physical function Baseline:  Goal status: INITIAL  3.  Pt will be indep with final aquatic HEP for continued management of condition Baseline:  Goal status: INITIAL      PLAN:  PT FREQUENCY: 1-2x/week  PT DURATION: 8 weeks  PLANNED  INTERVENTIONS: 97110-Therapeutic exercises, 97530- Therapeutic activity, 97112- Neuromuscular re-education, 97535- Self Care, 02859- Manual therapy, [id number]- Gait training, [phone]- Aquatic Therapy, [phone] (1-2 muscles), 20561 (3+ muscles)- Dry Needling, Patient/Family education, Balance training, Stair training, Taping, Joint mobilization, DME instructions, Cryotherapy, and Moist heat.  PLAN FOR NEXT SESSION: aquatic only: trail for pain management, toleration to movement and strengthening.   Ronal Independence) Matson Welch MPT 02/19/24 10:39 AM [facility] [address] [place], [place], [address] Phone: [phone]   Fax:  [phone]   "

## 2024-02-24 ENCOUNTER — Ambulatory Visit (HOSPITAL_BASED_OUTPATIENT_CLINIC_OR_DEPARTMENT_OTHER): Admitting: Physical Therapy

## 2024-02-25 ENCOUNTER — Encounter (HOSPITAL_BASED_OUTPATIENT_CLINIC_OR_DEPARTMENT_OTHER): Payer: Self-pay | Admitting: Physical Therapy

## 2024-02-26 ENCOUNTER — Ambulatory Visit (HOSPITAL_BASED_OUTPATIENT_CLINIC_OR_DEPARTMENT_OTHER): Admitting: Physical Therapy

## 2024-03-02 ENCOUNTER — Ambulatory Visit (HOSPITAL_BASED_OUTPATIENT_CLINIC_OR_DEPARTMENT_OTHER): Admitting: Physical Therapy

## 2024-03-04 ENCOUNTER — Ambulatory Visit (HOSPITAL_BASED_OUTPATIENT_CLINIC_OR_DEPARTMENT_OTHER): Admitting: Physical Therapy

## 2024-03-04 ENCOUNTER — Encounter (HOSPITAL_BASED_OUTPATIENT_CLINIC_OR_DEPARTMENT_OTHER): Payer: Self-pay | Admitting: Physical Therapy

## 2024-03-04 DIAGNOSIS — M5459 Other low back pain: Secondary | ICD-10-CM | POA: Diagnosis not present

## 2024-03-04 DIAGNOSIS — M6281 Muscle weakness (generalized): Secondary | ICD-10-CM

## 2024-03-04 DIAGNOSIS — R2689 Other abnormalities of gait and mobility: Secondary | ICD-10-CM

## 2024-03-04 NOTE — Therapy (Signed)
 " OUTPATIENT PHYSICAL THERAPY THORACOLUMBAR TREATMENT Progress Note Reporting Period 01/07/24 to 03/04/24  See note below for Objective Data and Assessment of Progress/Goals.      Patient Name: Tonya Norton MRN: [id number] DOB:[birth year], 69 y.o., female Today's Date: 03/04/2024  END OF SESSION:  PT End of Session - 03/04/24 1418     Visit Number 10    Date for Recertification  04/02/24    Authorization Type mcr    Progress Note Due on Visit 1020    PT Start Time 1400    PT Stop Time 1440    PT Time Calculation (min) 40 min    Activity Tolerance Patient tolerated treatment well;Patient limited by pain    Behavior During Therapy [facility] for tasks assessed/performed               Past Medical History:  Diagnosis Date   Anxiety    Chronic fatigue syndrome    Closed head injury with concussion    Cystic breast    DDD (degenerative disc disease)    Depression    Family history of adverse reaction to anesthesia    father had N/V under general anesthesia   GERD (gastroesophageal reflux disease)    Heart murmur    HSV (herpes simplex virus) infection    Hypertension    Hypothyroidism    Migraines    Menstrual   Mitral regurgitation    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, myxomatous mitral valve   Mitral regurgitation 12/05/2020   Mitral valve prolapse    Nocturia 07/23/2013   Raynaud's disease    Rosacea    Snoring 07/23/2013   Thyroid  nodule    Past Surgical History:  Procedure Laterality Date   ARTERY BIOPSY Right 04/01/2022   Procedure: RIGHT BIOPSY TEMPORAL ARTERY;  Surgeon: Gretta Lonni PARAS, MD;  Location: [facility] OR;  Service: Vascular;  Laterality: Right;   benign right breast biopsy     BREAST EXCISIONAL BIOPSY Right    DILATION AND CURETTAGE OF UTERUS     laser to cervix     MYOMECTOMY  1986   SHOULDER SURGERY Right    Patient Active Problem List   Diagnosis Date Noted   Right sided temporal headache 03/26/2022   Mitral regurgitation 12/05/2020   Stenosis of  left subclavian artery 07/25/2020   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   HTN (hypertension) 05/31/2020   HLD (hyperlipidemia) 05/31/2020   Snoring 07/23/2013   Nocturia 07/23/2013    PCP: Oneil Neth MD REFERRING PROVIDER: Darryle Aver MD  REFERRING DIAG: M53.3 (ICD-10-CM) - Sacrococcygeal disorders, not elsewhere classified   Rationale for Evaluation and Treatment: Rehabilitation  THERAPY DIAG:  Other low back pain - Plan: PT plan of care cert/re-cert  Muscle weakness (generalized) - Plan: PT plan of care cert/re-cert  Other abnormalities of gait and mobility - Plan: PT plan of care cert/re-cert  ONSET DATE: 1 yr  SUBJECTIVE:  SUBJECTIVE STATEMENT: I have been basically immobile last week due to weather so I am good 3/10.  Overall pain has not changed     Initial Subjective My SIJ is OK. 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   Saw John O'Hallaron  (PT) he believes problem is the cysts. I was unable to tolerate any land based therapy, increased my pain. I have an appt at the [facility] to see a specialist in Feb. I have modified my activity to avoid increased pain. Can't bend or lift. Wanted to try aquatic therapy to see if I could exercise there to try to maintain some strength.  Feel like I am getting weak.  PERTINENT HISTORY:  Rt sacral pain and foot paraesthesias  PAIN:  Are you having pain? Yes: NPRS scale: current 3/10; worst 5-6/10; least 1/10 Pain location: Pain in LB with numbness into right foot  Pain description: ache; numbness Aggravating factors: positional depended: walking 10 mins; flex; lifting Relieving factors: lying flat  PRECAUTIONS: None  RED FLAGS: None   WEIGHT BEARING RESTRICTIONS: No  FALLS:  Has patient fallen in last 6  months? No  OCCUPATION: retired pensions consultant  PLOF: Independent  PATIENT GOALS: get some exercises and move  NEXT MD VISIT: Feb  OBJECTIVE:  Note: Objective measures were completed at Evaluation unless otherwise noted.  DIAGNOSTIC FINDINGS:  7/25 Lumbar MRI FINDINGS: Segmentation:  Standard.   Alignment: Lumbar lordosis is maintained. No significant listhesis.   Vertebrae: No bone marrow edema or evidence of fracture. Vertebral body heights are maintained. No suspicious osseous lesion.   Conus medullaris and cauda equina: Conus extends to the T12-L1 level. Conus and cauda equina appear normal.   Paraspinal and other soft tissues: The paraspinal soft tissues are unremarkable. Prominent Tarlov cyst along the dorsal aspect of the sacrum particularly at the S2 level, largest cyst measures 3.1 x 2.0 x 2.9 cm.   Disc levels:   T12-L1: No significant disc bulge. No significant spinal canal stenosis or foraminal stenosis. Mild facet arthrosis.   L1-2: No significant disc bulge. No significant spinal canal stenosis or foraminal stenosis.   L2-3: No significant disc bulge. Mild-to-moderate facet arthrosis and thickening of the ligamentum flavum. No significant spinal canal or foraminal stenosis.   L3-4: No significant disc bulge. Moderate facet arthrosis and thickening of the ligamentum flavum. No significant spinal canal or foraminal stenosis.   L4-5: Disc desiccation and mild disc height loss. Central/right paracentral annular fissure measuring up to 9 mm in length. Broad-based central disc protrusion which indents the ventral thecal sac. Mild lateral recess narrowing on the left. Moderate facet arthrosis and thickening of the ligamentum flavum. No significant spinal canal or foraminal stenosis.   L5-S1: Disc desiccation and mild-to-moderate disc height loss. Annular fissure extending from the right paracentral region into the left subarticular/foraminal region over length  of approximately 14 mm. No significant spinal canal or foraminal stenosis.   IMPRESSION: Degenerative changes as above. No high-grade spinal canal or foraminal stenosis.   Annular fissures at the L4-5 and L5-S1 levels as described above.   Prominent Tarlov cysts.  PATIENT SURVEYS:  ODI:  23/50=46%  COGNITION: Overall cognitive status: Within functional limits for tasks assessed       MUSCLE LENGTH: Hamstrings: tight   POSTURE: decreased lumbar lordosis   LUMBAR ROM:   AROM eval  Flexion Full P!  Extension P!  Right lateral flexion   Left lateral flexion   Right rotation  Left rotation    (Blank rows = not tested)  LOWER EXTREMITY ROM:     WFL  LOWER EXTREMITY MMT:    MMT Right eval Left eval R / L 03/04/24  Hip flexion 22.2 28.4 Pt politely declined see impression  Hip extension     Hip abduction 18.6 23.4   Hip adduction     Hip internal rotation     Hip external rotation     Knee flexion     Knee extension 38.6 42.5   Ankle dorsiflexion     Ankle plantarflexion     Ankle inversion     Ankle eversion      (Blank rows = not tested)   FUNCTIONAL TESTS:  Timed up and go (TUG): 9.29   4 stage balance: passed x 4 GAIT: Distance walked: 500 Assistive device utilized: None Level of assistance: Complete Independence Comments: wfl  TREATMENT  [facility] Adult PT Treatment:                                                DATE: 03/04/24 Pt seen for aquatic therapy today.  Treatment took place in water 3.5-4.75 ft in depth at the [facility] pool. Temp of water was 91.  Pt entered/exited the pool via stairs with hand rail.  (Trial sitting balance on noodle next session) *walking forward, back and side stepping in 3.6-4.6 ft with unsupported x 5 minutes *using yellow noodle wrapped anteriorly across chest prone suspension ue support corner wall x 3 holding ~20-30 s: BKTC 2 x5; hip add/abd 2 x 5 *ue support wall 4.4 ft: toe raises; heel raises x 10;  hip add/abd/flex/ext 3 x 5; ue unsupported HS curls 2 x 5 *decompression on noodle *noodle wrapped anteriorly:cycling; breast strike motion ue for treading water using noodle   Pt requires the buoyancy and hydrostatic pressure of water for support, and to offload joints by unweighting joint load by at least 50 % in navel deep water and by at least 75-80% in chest to neck deep water.  Viscosity of the water is needed for resistance of strengthening. Water current perturbations provides challenge to standing balance requiring increased core activation.                                                                                                                                PATIENT EDUCATION:  Education details: Discussed eval findings, rehab rationale, aquatic program progression/POC and pools in area. Patient is in agreement  Person educated: Patient Education method: Explanation Education comprehension: verbalized understanding  HOME EXERCISE PROGRAM: Does not tolerate land based ex. Aquatic TBA  ASSESSMENT:  CLINICAL IMPRESSION: PN: Pt has made some progress albeit slow up to this point.  She has politely declined ms testing due to the high pain flare she experienced  after initial evaluation. ODI completed and indicates a 4% point improvement in pt perception of function. We have established pt limits with exercise for tolerated engagement of muscles which is allowing for maintaining and improving muscle strength, agility and improving mental health. She has appt with specialist at [facility] 03/17/24 who specializes in Tarlov cysts.  Plan to see her 1 x week x 1 month other than week in [place] then post MD appt to manage condition/pain with approp aquatic HEP.   Initial Impression Patient is a 69 y.o. f who was seen today for physical therapy evaluation and treatment for SIJ pain. She has had injections without relief.  She has prominent Tarlov cysts in lower lumbar spine into S1 area  which she believes to be the source of her pain.  She has an appt with a specialist at [facility] in [place] in Feb to address.  She is moderately pain sensitive in lumbosacral area more prominently on R vs L with numbness in foot after activity in standing > 15 mins.  Lying supine with knees elevated is only position that eliminates pain. Strength deficits in le and core. Pt goal is to be able to exercise maintaining strength as able without excessive pain. She is a good candidate for aquatic intervention and will benefit from the properties of water to progress towards functional goals.   OBJECTIVE IMPAIRMENTS: decreased activity tolerance, decreased mobility, difficulty walking, decreased strength, and pain.   ACTIVITY LIMITATIONS: carrying, lifting, bending, sitting, standing, transfers, and locomotion level  PARTICIPATION LIMITATIONS: cleaning, laundry, shopping, community activity, and yard work  [facility] POTENTIAL: Good  CLINICAL DECISION MAKING: Stable/uncomplicated  EVALUATION COMPLEXITY: Low   GOALS: Goals reviewed with patient? Yes  SHORT TERM GOALS: Target date: 02/19/24  Pt will tolerate full aquatic sessions consistently without increase in pain and with improving function to demonstrate good toleration and effectiveness of intervention.  Baseline: Goal status: Met 02/09/24  2.  Pt will report decrease in pain by at least 50% for improved toleration to activity/quality of life and to demonstrate improved management of pain. Baseline:  Goal status: In progress 02/16/24; 02/19/24; 03/04/24  3.  Pt will consider gaining pool access for use of the properties of water for chronic conditions maintaining mobility and minimizing pain. Baseline:  Goal status: In progress 02/11/24; 02/19/24; 03/04/24    LONG TERM GOALS: Target date: 04/02/24  Pt to improve on ODI by 13% to demonstrate statistically significant Improvement in function. (MCID 13-15%) Baseline: 23/50=46%; 21/50=42% Goal status: In  progress 1/29  2.  Pt will improve strength in all areas listed by 5lbs to demonstrate improved overall physical function Baseline:  Goal status: In progress 03/04/24  3.  Pt will be indep with final aquatic HEP for continued management of condition Baseline:  Goal status: In progress 03/04/24      PLAN:  PT FREQUENCY: 1-2x/week  PT DURATION: 4 weeks  PLANNED INTERVENTIONS: 97110-Therapeutic exercises, 97530- Therapeutic activity, 97112- Neuromuscular re-education, 97535- Self Care, [id number]- Manual therapy, [phone]- Gait training, [phone]- Aquatic Therapy, [phone] (1-2 muscles), [phone] (3+ muscles)- Dry Needling, Patient/Family education, Balance training, Stair training, Taping, Joint mobilization, DME instructions, Cryotherapy, and Moist heat.  PLAN FOR NEXT SESSION: aquatic only: trail for pain management, toleration to movement and strengthening.   Ronal Bristow) Waylyn Tenbrink MPT 03/04/24 2:47 PM [facility] [address] [place], [place], [address] Phone: [phone]   Fax:  [phone]   "

## 2024-03-10 ENCOUNTER — Ambulatory Visit (HOSPITAL_BASED_OUTPATIENT_CLINIC_OR_DEPARTMENT_OTHER): Payer: Self-pay | Admitting: Physical Therapy

## 2024-03-10 ENCOUNTER — Encounter (HOSPITAL_BASED_OUTPATIENT_CLINIC_OR_DEPARTMENT_OTHER): Payer: Self-pay | Admitting: Physical Therapy

## 2024-03-10 DIAGNOSIS — M6281 Muscle weakness (generalized): Secondary | ICD-10-CM

## 2024-03-10 DIAGNOSIS — R2689 Other abnormalities of gait and mobility: Secondary | ICD-10-CM

## 2024-03-10 DIAGNOSIS — M5459 Other low back pain: Secondary | ICD-10-CM

## 2024-03-10 NOTE — Therapy (Signed)
 " OUTPATIENT PHYSICAL THERAPY THORACOLUMBAR TREATMENT    Patient Name: Tonya Norton MRN: [id number] DOB:03-28-1955, 69 y.o., female Today's Date: 03/10/2024  END OF SESSION:  PT End of Session - 03/10/24 1457     Visit Number 11    Date for Recertification  04/02/24    Authorization Type mcr    Progress Note Due on Visit 20    PT Start Time 1446    PT Stop Time 1525    PT Time Calculation (min) 39 min    Activity Tolerance Patient tolerated treatment well;Patient limited by pain    Behavior During Therapy WFL for tasks assessed/performed                Past Medical History:  Diagnosis Date   Anxiety    Chronic fatigue syndrome    Closed head injury with concussion    Cystic breast    DDD (degenerative disc disease)    Depression    Family history of adverse reaction to anesthesia    father had N/V under general anesthesia   GERD (gastroesophageal reflux disease)    Heart murmur    HSV (herpes simplex virus) infection    Hypertension    Hypothyroidism    Migraines    Menstrual   Mitral regurgitation    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, myxomatous mitral valve   Mitral regurgitation 12/05/2020   Mitral valve prolapse    Nocturia 07/23/2013   Raynaud's disease    Rosacea    Snoring 07/23/2013   Thyroid  nodule    Past Surgical History:  Procedure Laterality Date   ARTERY BIOPSY Right 04/01/2022   Procedure: RIGHT BIOPSY TEMPORAL ARTERY;  Surgeon: Gretta Lonni PARAS, MD;  Location: [facility] OR;  Service: Vascular;  Laterality: Right;   benign right breast biopsy     BREAST EXCISIONAL BIOPSY Right    DILATION AND CURETTAGE OF UTERUS     laser to cervix     MYOMECTOMY  1986   SHOULDER SURGERY Right    Patient Active Problem List   Diagnosis Date Noted   Right sided temporal headache 03/26/2022   Mitral regurgitation 12/05/2020   Stenosis of left subclavian artery 07/25/2020   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  Foot is slightly numb pain 4/10     Initial Subjective My SIJ is OK. 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   Saw John O'Hallaron  (PT) he believes problem is the cysts. I was unable to tolerate any land based therapy, increased my pain. I have an appt at the [facility] to see a specialist in Feb. I have modified my activity to avoid increased pain. Can't bend or lift. Wanted to try aquatic therapy to see if I could exercise there to try to maintain some strength.  Feel like I am getting weak.  PERTINENT HISTORY:  Rt sacral pain and foot paraesthesias  PAIN:  Are you having pain? Yes: NPRS scale: current 3/10; worst 5-6/10; least 1/10 Pain location: Pain in LB with numbness into right foot  Pain description: ache; numbness Aggravating factors: positional depended: walking 10 mins; flex; lifting Relieving factors: lying flat  PRECAUTIONS: None  RED FLAGS: None   WEIGHT BEARING RESTRICTIONS: No  FALLS:  Has patient fallen in last 6 months? No  OCCUPATION: retired pensions consultant  PLOF: Independent  PATIENT GOALS: get some exercises and move  NEXT MD VISIT: Feb  OBJECTIVE:  Note: Objective measures were completed at Evaluation unless otherwise  noted.  DIAGNOSTIC FINDINGS:  7/25 Lumbar MRI FINDINGS: Segmentation:  Standard.   Alignment: Lumbar lordosis is maintained. No significant listhesis.   Vertebrae: No bone marrow edema or evidence of fracture. Vertebral body heights are maintained. No suspicious osseous lesion.   Conus medullaris and cauda equina: Conus extends to the T12-L1 level. Conus and cauda equina appear normal.   Paraspinal and other soft tissues: The paraspinal soft tissues are unremarkable. Prominent Tarlov cyst along the dorsal aspect of the sacrum particularly at the S2 level, largest cyst measures 3.1 x 2.0 x 2.9 cm.   Disc levels:   T12-L1: No significant disc bulge. No significant spinal canal stenosis or foraminal stenosis. Mild facet arthrosis.   L1-2: No significant disc bulge. No significant spinal canal stenosis or foraminal stenosis.   L2-3: No significant disc bulge. Mild-to-moderate facet arthrosis and thickening of the ligamentum flavum. No significant spinal canal or foraminal stenosis.   L3-4: No significant disc bulge. Moderate facet arthrosis and thickening of the ligamentum flavum. No significant spinal canal or foraminal stenosis.   L4-5: Disc desiccation and mild disc height loss. Central/right paracentral annular fissure measuring up to 9 mm in length. Broad-based central disc protrusion which indents the ventral thecal sac. Mild lateral recess narrowing on the left. Moderate facet arthrosis and thickening of the ligamentum flavum. No significant spinal canal or foraminal stenosis.   L5-S1: Disc desiccation and mild-to-moderate disc height loss. Annular fissure extending from the right paracentral region into the left subarticular/foraminal region over length of approximately 14 mm. No significant spinal canal or foraminal stenosis.   IMPRESSION: Degenerative changes as above. No high-grade spinal canal or foraminal stenosis.   Annular fissures at the L4-5 and L5-S1  levels as described above.   Prominent Tarlov cysts.  PATIENT SURVEYS:  ODI:  23/50=46%  COGNITION: Overall cognitive status: Within functional limits for tasks assessed       MUSCLE LENGTH: Hamstrings: tight   POSTURE: decreased lumbar lordosis   LUMBAR ROM:   AROM eval  Flexion Full P!  Extension P!  Right lateral flexion   Left lateral flexion   Right rotation  Left rotation    (Blank rows = not tested)  LOWER EXTREMITY ROM:     WFL  LOWER EXTREMITY MMT:    MMT Right eval Left eval R / L 03/04/24  Hip flexion 22.2 28.4 Pt politely declined see impression  Hip extension     Hip abduction 18.6 23.4   Hip adduction     Hip internal rotation     Hip external rotation     Knee flexion     Knee extension 38.6 42.5   Ankle dorsiflexion     Ankle plantarflexion     Ankle inversion     Ankle eversion      (Blank rows = not tested)   FUNCTIONAL TESTS:  Timed up and go (TUG): 9.29   4 stage balance: passed x 4 GAIT: Distance walked: 500 Assistive device utilized: None Level of assistance: Complete Independence Comments: wfl  TREATMENT  [facility] Adult PT Treatment:                                                DATE: 03/04/24 Pt seen for aquatic therapy today.  Treatment took place in water 3.5-4.75 ft in depth at the [facility] pool. Temp of water was 91.  Pt entered/exited the pool via stairs with hand rail.   *decompression on noodle *using yellow noodle wrapped anteriorly across chest prone suspension ue support corner wall x 3 holding ~20-30 s: BKTC 2 x5; hip add/abd 2 x 5 *short trial of sitting balance on black noodle: not tolerated today as she arrives with slightly higher pain sensitivity  *ue support wall 4.4 ft: toe raises; heel raises x 10; hip add/abd/flex/ext 3 x 5; ue unsupported HS curls 2 x 5 *walking forward, back and side stepping in 3.6-4.6 ft with yellow HB shoulder horizontal abd/add   Pt requires the buoyancy and  hydrostatic pressure of water for support, and to offload joints by unweighting joint load by at least 50 % in navel deep water and by at least 75-80% in chest to neck deep water.  Viscosity of the water is needed for resistance of strengthening. Water current perturbations provides challenge to standing balance requiring increased core activation.                                                                                                                                PATIENT EDUCATION:  Education details: Discussed eval findings, rehab rationale, aquatic program progression/POC and pools in area. Patient is in agreement  Person educated: Patient Education method: Explanation Education comprehension: verbalized understanding  HOME EXERCISE PROGRAM: Does not tolerate land based ex. Aquatic TBA  Access Code: [email or web address] URL: [email or web address] Date: 03/10/2024 Prepared by: Matilda Kohut  Exercises - Cycling on noodle/noodle wrapped under shoulders  - 1 x daily - 7 x  weekly - 3 sets - 10 reps - Prone to Supine Transistion with Foam Dumbells and Ankle Floats  - 1 x daily - 7 x weekly - 3 sets - 10 reps  ASSESSMENT:  CLINICAL IMPRESSION: Pt instruction (VC and demonstration) on proper lifting technique for small dog allowing dog to jump onto sofa then walk onto her lap then standing.  She VU. She arrives with slightly higher pain today after lifting dog off floor (9 lb). Toleration to session some limited.  She, as always, puts forth good effort. Sees MD in [place] next week.  Plan on finishing final aquatic HEP and issuing next session unless MD changes POC.  Pt is aware and in agreement  PN: Pt has made some progress albeit slow up to this point.  She has politely declined ms testing due to the high pain flare she experienced after initial evaluation. ODI completed and indicates a 4% point improvement in pt perception of function. We have established pt limits with  exercise for tolerated engagement of muscles which is allowing for maintaining and improving muscle strength, agility and improving mental health. She has appt with specialist at [facility] 03/17/24 who specializes in Tarlov cysts.  Plan to see her 1 x week x 1 month other than week in [place] then post MD appt to manage condition/pain with approp aquatic HEP.   Initial Impression Patient is a 69 y.o. f who was seen today for physical therapy evaluation and treatment for SIJ pain. She has had injections without relief.  She has prominent Tarlov cysts in lower lumbar spine into S1 area which she believes to be the source of her pain.  She has an appt with a specialist at [facility] in [place] in Feb to address.  She is moderately pain sensitive in lumbosacral area more prominently on R vs L with numbness in foot after activity in standing > 15 mins.  Lying supine with knees elevated is only position that eliminates pain. Strength deficits in le and core. Pt goal is to be able to exercise maintaining strength as able without excessive pain. She is a good candidate for aquatic intervention and will benefit from the properties of water to progress towards functional goals.   OBJECTIVE IMPAIRMENTS: decreased activity tolerance, decreased mobility, difficulty walking, decreased strength, and pain.   ACTIVITY LIMITATIONS: carrying, lifting, bending, sitting, standing, transfers, and locomotion level  PARTICIPATION LIMITATIONS: cleaning, laundry, shopping, community activity, and yard work  [facility] POTENTIAL: Good  CLINICAL DECISION MAKING: Stable/uncomplicated  EVALUATION COMPLEXITY: Low   GOALS: Goals reviewed with patient? Yes  SHORT TERM GOALS: Target date: 02/19/24  Pt will tolerate full aquatic sessions consistently without increase in pain and with improving function to demonstrate good toleration and effectiveness of intervention.  Baseline: Goal status: Met 02/09/24  2.  Pt will report decrease in pain  by at least 50% for improved toleration to activity/quality of life and to demonstrate improved management of pain. Baseline:  Goal status: In progress 02/16/24; 02/19/24; 03/04/24  3.  Pt will consider gaining pool access for use of the properties of water for chronic conditions maintaining mobility and minimizing pain. Baseline:  Goal status: In progress 02/11/24; 02/19/24; 03/04/24    LONG TERM GOALS: Target date: 04/02/24  Pt to improve on ODI by 13% to demonstrate statistically significant Improvement in function. (MCID 13-15%) Baseline: 23/50=46%; 21/50=42% Goal status: In progress 1/29  2.  Pt will improve strength in all areas listed by 5lbs to demonstrate improved overall  physical function Baseline:  Goal status: In progress 03/04/24  3.  Pt will be indep with final aquatic HEP for continued management of condition Baseline:  Goal status: In progress 03/04/24      PLAN:  PT FREQUENCY: 1-2x/week  PT DURATION: 4 weeks  PLANNED INTERVENTIONS: 97110-Therapeutic exercises, 97530- Therapeutic activity, 97112- Neuromuscular re-education, 97535- Self Care, [id number]- Manual therapy, [phone]- Gait training, [phone]- Aquatic Therapy, [phone] (1-2 muscles), [phone] (3+ muscles)- Dry Needling, Patient/Family education, Balance training, Stair training, Taping, Joint mobilization, DME instructions, Cryotherapy, and Moist heat.  PLAN FOR NEXT SESSION: aquatic only: trail for pain management, toleration to movement and strengthening.   [address] [place]) Lisanne Ponce MPT 03/10/24 3:24 PM [facility] [address] [place], [place], [address] Phone: [phone]   Fax:  [phone]   "

## 2024-03-23 ENCOUNTER — Ambulatory Visit (HOSPITAL_BASED_OUTPATIENT_CLINIC_OR_DEPARTMENT_OTHER): Payer: Self-pay | Admitting: Physical Therapy

## 2024-04-19 ENCOUNTER — Ambulatory Visit (HOSPITAL_BASED_OUTPATIENT_CLINIC_OR_DEPARTMENT_OTHER): Admitting: Cardiovascular Disease
